# Patient Record
Sex: Female | Born: 1960 | Race: Black or African American | Hispanic: No | Marital: Married | Smoking: Former smoker
Health system: Southern US, Community
[De-identification: ages and names within clinical notes are randomized; demographics above are authoritative.]

## PROBLEM LIST (undated history)

## (undated) ENCOUNTER — Ambulatory Visit (HOSPITAL_COMMUNITY): Admission: EM | Payer: Commercial Managed Care - HMO | Source: Home / Self Care

## (undated) DIAGNOSIS — G4730 Sleep apnea, unspecified: Secondary | ICD-10-CM

## (undated) DIAGNOSIS — R609 Edema, unspecified: Secondary | ICD-10-CM

## (undated) DIAGNOSIS — M51369 Other intervertebral disc degeneration, lumbar region without mention of lumbar back pain or lower extremity pain: Secondary | ICD-10-CM

## (undated) DIAGNOSIS — K219 Gastro-esophageal reflux disease without esophagitis: Secondary | ICD-10-CM

## (undated) DIAGNOSIS — G589 Mononeuropathy, unspecified: Secondary | ICD-10-CM

## (undated) DIAGNOSIS — D219 Benign neoplasm of connective and other soft tissue, unspecified: Secondary | ICD-10-CM

## (undated) DIAGNOSIS — M1990 Unspecified osteoarthritis, unspecified site: Secondary | ICD-10-CM

## (undated) DIAGNOSIS — M21612 Bunion of left foot: Secondary | ICD-10-CM

## (undated) DIAGNOSIS — I10 Essential (primary) hypertension: Secondary | ICD-10-CM

## (undated) DIAGNOSIS — Z967 Presence of other bone and tendon implants: Secondary | ICD-10-CM

## (undated) DIAGNOSIS — Z903 Acquired absence of stomach [part of]: Secondary | ICD-10-CM

## (undated) DIAGNOSIS — I82409 Acute embolism and thrombosis of unspecified deep veins of unspecified lower extremity: Secondary | ICD-10-CM

## (undated) DIAGNOSIS — Z9889 Other specified postprocedural states: Secondary | ICD-10-CM

## (undated) DIAGNOSIS — N281 Cyst of kidney, acquired: Secondary | ICD-10-CM

## (undated) DIAGNOSIS — Z96652 Presence of left artificial knee joint: Secondary | ICD-10-CM

## (undated) DIAGNOSIS — R42 Dizziness and giddiness: Secondary | ICD-10-CM

## (undated) DIAGNOSIS — M722 Plantar fascial fibromatosis: Secondary | ICD-10-CM

## (undated) DIAGNOSIS — K6289 Other specified diseases of anus and rectum: Secondary | ICD-10-CM

## (undated) DIAGNOSIS — M1712 Unilateral primary osteoarthritis, left knee: Secondary | ICD-10-CM

## (undated) DIAGNOSIS — M5136 Other intervertebral disc degeneration, lumbar region: Secondary | ICD-10-CM

## (undated) HISTORY — DX: Presence of left artificial knee joint: Z96.652

## (undated) HISTORY — DX: Essential (primary) hypertension: I10

## (undated) HISTORY — DX: Other intervertebral disc degeneration, lumbar region: M51.36

## (undated) HISTORY — DX: Unspecified osteoarthritis, unspecified site: M19.90

## (undated) HISTORY — PX: REVISION TOTAL KNEE ARTHROPLASTY: SUR1280

## (undated) HISTORY — DX: Acquired absence of stomach (part of): Z90.3

## (undated) HISTORY — DX: Plantar fascial fibromatosis: M72.2

## (undated) HISTORY — DX: Benign neoplasm of connective and other soft tissue, unspecified: D21.9

## (undated) HISTORY — PX: ORIF RADIAL FRACTURE: SHX5113

## (undated) HISTORY — DX: Sleep apnea, unspecified: G47.30

## (undated) HISTORY — DX: Acute embolism and thrombosis of unspecified deep veins of unspecified lower extremity: I82.409

## (undated) HISTORY — DX: Other intervertebral disc degeneration, lumbar region without mention of lumbar back pain or lower extremity pain: M51.369

## (undated) HISTORY — DX: Other specified postprocedural states: Z98.890

## (undated) HISTORY — DX: Mononeuropathy, unspecified: G58.9

## (undated) HISTORY — DX: Gastro-esophageal reflux disease without esophagitis: K21.9

## (undated) HISTORY — DX: Edema, unspecified: R60.9

## (undated) HISTORY — DX: Cyst of kidney, acquired: N28.1

## (undated) HISTORY — DX: Unilateral primary osteoarthritis, left knee: M17.12

## (undated) HISTORY — DX: Dizziness and giddiness: R42

## (undated) HISTORY — DX: Presence of other bone and tendon implants: Z96.7

## (undated) HISTORY — DX: Other specified diseases of anus and rectum: K62.89

## (undated) HISTORY — DX: Bunion of left foot: M21.612

## (undated) HISTORY — PX: TOTAL KNEE ARTHROPLASTY: SHX125

## (undated) HISTORY — PX: ABDOMINAL HYSTERECTOMY: SHX81

## (undated) HISTORY — PX: BREAST SURGERY: SHX581

## (undated) HISTORY — DX: Morbid (severe) obesity due to excess calories: E66.01

---

## 1999-07-22 ENCOUNTER — Encounter (INDEPENDENT_AMBULATORY_CARE_PROVIDER_SITE_OTHER): Payer: Self-pay

## 1999-07-22 ENCOUNTER — Other Ambulatory Visit: Admission: RE | Admit: 1999-07-22 | Discharge: 1999-07-22 | Payer: Self-pay | Admitting: Obstetrics

## 2000-11-28 ENCOUNTER — Other Ambulatory Visit: Admission: RE | Admit: 2000-11-28 | Discharge: 2000-11-28 | Payer: Self-pay | Admitting: Obstetrics

## 2000-12-13 ENCOUNTER — Ambulatory Visit (HOSPITAL_COMMUNITY): Admission: RE | Admit: 2000-12-13 | Discharge: 2000-12-13 | Payer: Self-pay | Admitting: Obstetrics

## 2000-12-13 ENCOUNTER — Encounter: Payer: Self-pay | Admitting: Obstetrics

## 2001-04-15 ENCOUNTER — Ambulatory Visit (HOSPITAL_COMMUNITY): Admission: RE | Admit: 2001-04-15 | Discharge: 2001-04-15 | Payer: Self-pay | Admitting: Obstetrics

## 2001-04-15 ENCOUNTER — Encounter: Payer: Self-pay | Admitting: Obstetrics

## 2002-01-27 ENCOUNTER — Emergency Department (HOSPITAL_COMMUNITY): Admission: EM | Admit: 2002-01-27 | Discharge: 2002-01-27 | Payer: Self-pay | Admitting: Emergency Medicine

## 2003-05-14 ENCOUNTER — Encounter: Admission: RE | Admit: 2003-05-14 | Discharge: 2003-05-14 | Payer: Self-pay | Admitting: Internal Medicine

## 2003-06-01 ENCOUNTER — Ambulatory Visit (HOSPITAL_COMMUNITY): Admission: RE | Admit: 2003-06-01 | Discharge: 2003-06-01 | Payer: Self-pay | Admitting: Obstetrics

## 2003-07-04 DIAGNOSIS — Z90710 Acquired absence of both cervix and uterus: Secondary | ICD-10-CM

## 2003-07-04 HISTORY — DX: Acquired absence of both cervix and uterus: Z90.710

## 2003-09-10 ENCOUNTER — Encounter: Admission: RE | Admit: 2003-09-10 | Discharge: 2003-09-10 | Payer: Self-pay | Admitting: Cardiology

## 2003-09-23 ENCOUNTER — Encounter (INDEPENDENT_AMBULATORY_CARE_PROVIDER_SITE_OTHER): Payer: Self-pay | Admitting: Specialist

## 2003-09-23 ENCOUNTER — Inpatient Hospital Stay (HOSPITAL_COMMUNITY): Admission: RE | Admit: 2003-09-23 | Discharge: 2003-09-26 | Payer: Self-pay | Admitting: Obstetrics

## 2003-10-15 ENCOUNTER — Emergency Department (HOSPITAL_COMMUNITY): Admission: EM | Admit: 2003-10-15 | Discharge: 2003-10-15 | Payer: Self-pay | Admitting: Emergency Medicine

## 2005-04-07 ENCOUNTER — Other Ambulatory Visit: Admission: RE | Admit: 2005-04-07 | Discharge: 2005-04-07 | Payer: Self-pay | Admitting: Family Medicine

## 2005-04-07 ENCOUNTER — Encounter: Admission: RE | Admit: 2005-04-07 | Discharge: 2005-04-07 | Payer: Self-pay | Admitting: Family Medicine

## 2006-04-17 ENCOUNTER — Encounter: Admission: RE | Admit: 2006-04-17 | Discharge: 2006-04-17 | Payer: Self-pay | Admitting: Obstetrics and Gynecology

## 2007-04-22 ENCOUNTER — Encounter: Admission: RE | Admit: 2007-04-22 | Discharge: 2007-04-22 | Payer: Self-pay | Admitting: Family Medicine

## 2008-01-07 ENCOUNTER — Inpatient Hospital Stay (HOSPITAL_COMMUNITY): Admission: RE | Admit: 2008-01-07 | Discharge: 2008-01-11 | Payer: Self-pay | Admitting: Orthopedic Surgery

## 2008-01-10 ENCOUNTER — Encounter (INDEPENDENT_AMBULATORY_CARE_PROVIDER_SITE_OTHER): Payer: Self-pay | Admitting: Orthopedic Surgery

## 2008-01-10 ENCOUNTER — Ambulatory Visit: Payer: Self-pay | Admitting: Surgery

## 2008-03-03 ENCOUNTER — Ambulatory Visit: Admission: RE | Admit: 2008-03-03 | Discharge: 2008-03-03 | Payer: Self-pay | Admitting: Orthopedic Surgery

## 2008-03-03 ENCOUNTER — Ambulatory Visit: Payer: Self-pay | Admitting: Surgery

## 2008-03-03 ENCOUNTER — Encounter (INDEPENDENT_AMBULATORY_CARE_PROVIDER_SITE_OTHER): Payer: Self-pay | Admitting: Orthopedic Surgery

## 2008-04-29 ENCOUNTER — Encounter: Admission: RE | Admit: 2008-04-29 | Discharge: 2008-04-29 | Payer: Self-pay | Admitting: Family Medicine

## 2008-05-26 ENCOUNTER — Encounter: Admission: RE | Admit: 2008-05-26 | Discharge: 2008-05-26 | Payer: Self-pay | Admitting: Family Medicine

## 2008-08-20 ENCOUNTER — Other Ambulatory Visit: Admission: RE | Admit: 2008-08-20 | Discharge: 2008-08-20 | Payer: Self-pay | Admitting: Family Medicine

## 2008-12-31 ENCOUNTER — Emergency Department (HOSPITAL_COMMUNITY): Admission: EM | Admit: 2008-12-31 | Discharge: 2008-12-31 | Payer: Self-pay | Admitting: Emergency Medicine

## 2009-04-23 ENCOUNTER — Ambulatory Visit (HOSPITAL_COMMUNITY): Admission: RE | Admit: 2009-04-23 | Discharge: 2009-04-23 | Payer: Self-pay | Admitting: Orthopedic Surgery

## 2009-04-30 ENCOUNTER — Encounter: Admission: RE | Admit: 2009-04-30 | Discharge: 2009-04-30 | Payer: Self-pay | Admitting: Family Medicine

## 2010-05-02 ENCOUNTER — Encounter: Admission: RE | Admit: 2010-05-02 | Discharge: 2010-05-02 | Payer: Self-pay | Admitting: Family Medicine

## 2010-07-24 ENCOUNTER — Encounter: Payer: Self-pay | Admitting: Family Medicine

## 2010-08-23 ENCOUNTER — Ambulatory Visit (INDEPENDENT_AMBULATORY_CARE_PROVIDER_SITE_OTHER): Payer: BC Managed Care – PPO

## 2010-08-23 ENCOUNTER — Inpatient Hospital Stay (INDEPENDENT_AMBULATORY_CARE_PROVIDER_SITE_OTHER)
Admission: RE | Admit: 2010-08-23 | Discharge: 2010-08-23 | Disposition: A | Discharge status: Home / Self Care | Payer: BC Managed Care – PPO | Source: Ambulatory Visit | Attending: Emergency Medicine | Admitting: Emergency Medicine

## 2010-08-23 DIAGNOSIS — M1990 Unspecified osteoarthritis, unspecified site: Secondary | ICD-10-CM

## 2010-08-23 DIAGNOSIS — M25469 Effusion, unspecified knee: Secondary | ICD-10-CM

## 2010-09-19 ENCOUNTER — Other Ambulatory Visit (HOSPITAL_COMMUNITY): Payer: Self-pay | Admitting: Orthopedic Surgery

## 2010-09-19 DIAGNOSIS — M25562 Pain in left knee: Secondary | ICD-10-CM

## 2010-09-26 ENCOUNTER — Encounter (HOSPITAL_COMMUNITY)
Admission: RE | Admit: 2010-09-26 | Discharge: 2010-09-26 | Disposition: A | Discharge status: Home / Self Care | Payer: BC Managed Care – PPO | Source: Ambulatory Visit | Attending: Orthopedic Surgery | Admitting: Orthopedic Surgery

## 2010-09-26 ENCOUNTER — Ambulatory Visit (HOSPITAL_COMMUNITY)
Admission: RE | Admit: 2010-09-26 | Discharge: 2010-09-26 | Disposition: A | Discharge status: Home / Self Care | Payer: BC Managed Care – PPO | Source: Ambulatory Visit | Attending: Orthopedic Surgery | Admitting: Orthopedic Surgery

## 2010-09-26 DIAGNOSIS — M25569 Pain in unspecified knee: Secondary | ICD-10-CM | POA: Insufficient documentation

## 2010-09-26 DIAGNOSIS — M25562 Pain in left knee: Secondary | ICD-10-CM

## 2010-09-26 DIAGNOSIS — I8390 Asymptomatic varicose veins of unspecified lower extremity: Secondary | ICD-10-CM | POA: Insufficient documentation

## 2010-09-26 DIAGNOSIS — Z96659 Presence of unspecified artificial knee joint: Secondary | ICD-10-CM | POA: Insufficient documentation

## 2010-09-26 MED ORDER — TECHNETIUM TC 99M MEDRONATE IV KIT
25.0000 | PACK | Freq: Once | INTRAVENOUS | Status: AC | PRN
  Administered 2010-09-26: 23.6 via INTRAVENOUS

## 2010-10-09 LAB — CBC
HCT: 36.6 % (ref 36.0–46.0)
Hemoglobin: 12.4 g/dL (ref 12.0–15.0)
MCHC: 34 g/dL (ref 30.0–36.0)
MCV: 86.4 fL (ref 78.0–100.0)
Platelets: 260 10*3/uL (ref 150–400)
RBC: 4.24 MIL/uL (ref 3.87–5.11)
RDW: 14.5 % (ref 11.5–15.5)
WBC: 5.9 10*3/uL (ref 4.0–10.5)

## 2010-10-09 LAB — BASIC METABOLIC PANEL
BUN: 4 mg/dL — ABNORMAL LOW (ref 6–23)
CO2: 30 mEq/L (ref 19–32)
Calcium: 9.3 mg/dL (ref 8.4–10.5)
Chloride: 106 mEq/L (ref 96–112)
Creatinine, Ser: 0.68 mg/dL (ref 0.4–1.2)
GFR calc Af Amer: >60 mL/min (ref >60)
GFR calc non Af Amer: >60 mL/min (ref >60)
Glucose, Bld: 105 mg/dL — ABNORMAL HIGH (ref 70–99)
Potassium: 4 mEq/L (ref 3.5–5.1)
Sodium: 141 mEq/L (ref 135–145)

## 2010-10-09 LAB — DIFFERENTIAL
Basophils Absolute: 0 10*3/uL (ref 0.0–0.1)
Basophils Relative: 0 % (ref 0–1)
Eosinophils Absolute: 0 10*3/uL (ref 0.0–0.7)
Eosinophils Relative: 1 % (ref 0–5)
Lymphocytes Relative: 18 % (ref 12–46)
Lymphs Abs: 1.1 10*3/uL (ref 0.7–4.0)
Monocytes Absolute: 0.5 10*3/uL (ref 0.1–1.0)
Monocytes Relative: 9 % (ref 3–12)
Neutro Abs: 4.3 10*3/uL (ref 1.7–7.7)
Neutrophils Relative %: 73 % (ref 43–77)

## 2010-11-07 ENCOUNTER — Other Ambulatory Visit: Payer: Self-pay | Admitting: Orthopedic Surgery

## 2010-11-07 ENCOUNTER — Other Ambulatory Visit (HOSPITAL_COMMUNITY): Payer: Self-pay | Admitting: Orthopedic Surgery

## 2010-11-07 ENCOUNTER — Encounter (HOSPITAL_COMMUNITY): Payer: BC Managed Care – PPO

## 2010-11-07 ENCOUNTER — Ambulatory Visit (HOSPITAL_COMMUNITY)
Admission: RE | Admit: 2010-11-07 | Discharge: 2010-11-07 | Disposition: A | Discharge status: Home / Self Care | Payer: BC Managed Care – PPO | Source: Ambulatory Visit | Attending: Orthopedic Surgery | Admitting: Orthopedic Surgery

## 2010-11-07 DIAGNOSIS — X58XXXA Exposure to other specified factors, initial encounter: Secondary | ICD-10-CM | POA: Insufficient documentation

## 2010-11-07 DIAGNOSIS — Z01812 Encounter for preprocedural laboratory examination: Secondary | ICD-10-CM | POA: Insufficient documentation

## 2010-11-07 DIAGNOSIS — T84029A Dislocation of unspecified internal joint prosthesis, initial encounter: Secondary | ICD-10-CM | POA: Insufficient documentation

## 2010-11-07 DIAGNOSIS — Z01818 Encounter for other preprocedural examination: Secondary | ICD-10-CM | POA: Insufficient documentation

## 2010-11-07 DIAGNOSIS — Z79899 Other long term (current) drug therapy: Secondary | ICD-10-CM | POA: Insufficient documentation

## 2010-11-07 DIAGNOSIS — I10 Essential (primary) hypertension: Secondary | ICD-10-CM

## 2010-11-07 DIAGNOSIS — Z86718 Personal history of other venous thrombosis and embolism: Secondary | ICD-10-CM | POA: Insufficient documentation

## 2010-11-07 DIAGNOSIS — Z90710 Acquired absence of both cervix and uterus: Secondary | ICD-10-CM | POA: Insufficient documentation

## 2010-11-07 DIAGNOSIS — Z96659 Presence of unspecified artificial knee joint: Secondary | ICD-10-CM | POA: Insufficient documentation

## 2010-11-07 LAB — BASIC METABOLIC PANEL
CO2: 27 mEq/L (ref 19–32)
Calcium: 9.8 mg/dL (ref 8.4–10.5)
Chloride: 101 mEq/L (ref 96–112)
GFR calc Af Amer: >60 mL/min (ref >60)
Glucose, Bld: 99 mg/dL (ref 70–99)
Potassium: 4.2 mEq/L (ref 3.5–5.1)
Sodium: 136 mEq/L (ref 135–145)

## 2010-11-07 LAB — DIFFERENTIAL
Basophils Absolute: 0 10*3/uL (ref 0.0–0.1)
Basophils Relative: 0 % (ref 0–1)
Eosinophils Absolute: 0.1 10*3/uL (ref 0.0–0.7)
Lymphs Abs: 2.2 10*3/uL (ref 0.7–4.0)
Monocytes Relative: 8 % (ref 3–12)
Neutro Abs: 4.2 10*3/uL (ref 1.7–7.7)
Neutrophils Relative %: 59 % (ref 43–77)

## 2010-11-07 LAB — PROTIME-INR
INR: 0.97 (ref 0.00–1.49)
Prothrombin Time: 13.1 seconds (ref 11.6–15.2)

## 2010-11-07 LAB — CBC
HCT: 39.6 % (ref 36.0–46.0)
Hemoglobin: 12.8 g/dL (ref 12.0–15.0)
MCH: 27.1 pg (ref 26.0–34.0)
MCHC: 32.3 g/dL (ref 30.0–36.0)
Platelets: 323 10*3/uL (ref 150–400)
RBC: 4.73 MIL/uL (ref 3.87–5.11)
RDW: 13.7 % (ref 11.5–15.5)
WBC: 7.2 10*3/uL (ref 4.0–10.5)

## 2010-11-07 LAB — URINALYSIS, ROUTINE W REFLEX MICROSCOPIC
Glucose, UA: NEGATIVE mg/dL
Hgb urine dipstick: NEGATIVE
Ketones, ur: NEGATIVE mg/dL
Nitrite: NEGATIVE
Protein, ur: NEGATIVE mg/dL
Specific Gravity, Urine: 1.02 (ref 1.005–1.030)
Urobilinogen, UA: 0.2 mg/dL (ref 0.0–1.0)
pH: 7.5 (ref 5.0–8.0)

## 2010-11-07 LAB — SURGICAL PCR SCREEN
MRSA, PCR: NEGATIVE
Staphylococcus aureus: NEGATIVE

## 2010-11-07 LAB — APTT: aPTT: 28 seconds (ref 24–37)

## 2010-11-14 DIAGNOSIS — R0602 Shortness of breath: Secondary | ICD-10-CM | POA: Diagnosis not present

## 2010-11-14 DIAGNOSIS — T84029A Dislocation of unspecified internal joint prosthesis, initial encounter: Principal | ICD-10-CM | POA: Diagnosis present

## 2010-11-14 DIAGNOSIS — Z96659 Presence of unspecified artificial knee joint: Secondary | ICD-10-CM

## 2010-11-14 DIAGNOSIS — I10 Essential (primary) hypertension: Secondary | ICD-10-CM | POA: Diagnosis present

## 2010-11-14 DIAGNOSIS — G4733 Obstructive sleep apnea (adult) (pediatric): Secondary | ICD-10-CM | POA: Diagnosis present

## 2010-11-14 DIAGNOSIS — M1710 Unilateral primary osteoarthritis, unspecified knee: Secondary | ICD-10-CM | POA: Diagnosis present

## 2010-11-14 DIAGNOSIS — X58XXXA Exposure to other specified factors, initial encounter: Secondary | ICD-10-CM | POA: Diagnosis present

## 2010-11-14 DIAGNOSIS — R079 Chest pain, unspecified: Secondary | ICD-10-CM | POA: Diagnosis not present

## 2010-11-14 DIAGNOSIS — Z01812 Encounter for preprocedural laboratory examination: Secondary | ICD-10-CM

## 2010-11-14 LAB — TYPE AND SCREEN: ABO/RH(D): AB POS

## 2010-11-14 NOTE — Op Note (Signed)
NAMEAKEILA, [name]              ACCOUNT NO.:  [id number]  MEDICAL RECORD NO.:  [id number]           PATIENT TYPE:  I  LOCATION:  0008                         FACILITY:  [facility]  PHYSICIAN:  Madlyn Frankel. Charlann Boxer, M.D.  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  FINDINGS:  The patient had no clinical concerns for infection in the preop state, nor perioperatively with normal clear synovial fluid.  PROCEDURE:  Revision left total knee replacement with polyethylene exchange.  The patient had a previously placed standard plus LCS femoral component that we matched with the new poly to increase the size 10 mm to 17.5 mm poly.  SURGEON:  Madlyn Frankel. Charlann Boxer, M.D.  ASSISTANT:  Jaquelyn Bitter. Chabon, P.A.  ANESTHESIA:  Preoperative regional femoral block plus general.  SPECIMENS:  None.  COMPLICATIONS:  None.  DRAINS:  One Hemovac.  TOURNIQUET TIME:  30 minutes at 250 mmHg.  INDICATIONS FOR PROCEDURE:  [name] is a 49 year old female with history of index left total knee replacement performed in 2009.  She presented to the office for second opinion evaluation due to recurring swelling and pain.  Diagnostic evaluation and clinical exam revealed concerns of instability from hyperextension through flexion.  Bone scan was relatively normal other than some increased uptake in the tip of the femoral tibial stem but this did not correlate with her symptoms.  After reviewing these findings and ruling out infection as source for her pain, we discussed revision surgery in setting of polyethylene exchange.  She appeared to have global instability with hyperextension as well as flexion instability.  For this reason, I  felt that we could perform this revision by way of polyethylene exchange as oppose to femoral revision.  After reviewing with her surgical options, she wished to proceed at this time with revision surgery.  Consent was obtained for benefit of pain relief including after reviewing risks of infection, DVT, and necessity for further revision surgery.  PROCEDURE IN DETAIL:  The patient was brought to the operative room theater.  Once adequate anesthesia, preoperative antibiotics had been administered, she was positioned supine with left thigh tourniquet placed.  I had a chance to re-examine the knee at this point under anesthesia confirming the clinical diagnosis made through the office.  The left lower extremity was prepped and draped in sterile fashion with the left foot placed in DeMayo leg holder.  The time-out was performed identifying the patient, planned procedure and the extremity.  The left lower extremity was then exsanguinated and tourniquet elevated to 250 mmHg.  The patient's old incision was utilized including its entire extent which I excised in an effort to revise the scar.  Sharp dissection was carried down to the extensor mechanism with soft tissue planes created.  The median arthrotomy was made including encountering normal synovial fluid, probably about 30 cc worth suctioned out.  This was clear synovial  fluid.  At this point, the first portion of this case was dedicated to a complete synovectomy in the medial and lateral aspects of the knees, peripatellar region, suprapatellar pouch and laterally. Following this complete synovectomy, the old size 10 mm polyethylene insert was removed.  A trial was carried out with 17.5 insert to make certain I was going to be happy with this and I felt the knee at this point blocked at full extension, no hyperextension, and the knee ligaments felt much more stable and balanced through flexion with normal tracking patella.  Given  this trial, this 17.5 insert to match the standard plus femoral component was chosen.  The knee was irrigated with normal saline solution, pulse lavage, and the final insert placed.  The final insert was then placed into the knee.  The knee was reirrigated.  The tourniquet was let down after 30 minutes.  The medium Hemovac drain was placed deep.  The extensor mechanism was then reapproximated using #1 Vicryl with the knee in flexion.  The remainder of the wound was closed with 2-0 Vicryl and a running 4-0 Monocryl.  The knee was cleaned, dried and dressed sterilely using Dermabond and Aquacel dressing, drain site dressed separately.  She was then extubated and brought to recovery room in stable condition, tolerated the procedure well.     Madlyn Frankel Charlann Boxer, M.D.     MDO/MEDQ  D:  11/14/2010  T:  11/14/2010  Job:  [id number]  Electronically Signed by Durene Romans M.D. on 11/14/2010 03:45:01 PM

## 2010-11-14 NOTE — H&P (Signed)
[name], [name]              ACCOUNT NO.:  [id number]  MEDICAL RECORD NO.:  [id number]          PATIENT TYPE:  LOCATION:                                 FACILITY:  PHYSICIAN:  Madlyn Frankel. Charlann Boxer, M.D.  DATE OF BIRTH:  [birth year]  DATE OF ADMISSION: DATE OF DISCHARGE:                             HISTORY & PHYSICAL   DATE OF SURGERY:  Nov 14, 2010.  ADMISSION DIAGNOSIS:  Instability, left knee.  HISTORY OF PRESENT ILLNESS:  This is a 50 year old lady with a history of total knee arthroplasty with instability who now is scheduled for poly exchange of her left total knee arthroplasty.  The surgery risk, benefits, and aftercare were discussed with the patient.  Questions invited and answered.  She understands there is a small chance that this may require a total knee revision once we were in there, and she would like to go ahead and get this fixed while there but hopefully this will be a straight poly exchange.  The surgery, risks, benefits, and aftercare were discussed with the patient.  Questions invited and answered.  Note that she has had a history of DVT and is not a candidate for tranexamic  acid and will not receive that in preop, also we will put her on  Xarelto for 10 days postoperatively and then proceed with 4 weeks of aspirin thereafter.  PAST MEDICAL HISTORY:  Drug allergy to OXYCODONE with nausea and vomiting.  CURRENT MEDICATIONS:  Advil.  MEDICAL ILLNESSES:  History of DVT.  PAST SURGERIES:  Hysterectomy and left total knee arthroplasty in 2009.  FAMILY HISTORY:  Positive for ovarian cancer.  SOCIAL HISTORY:  The patient is married.  She does not smoke and drinks rarely, and she will be going home after surgery.  REVIEW OF SYSTEMS:  CENTRAL NERVOUS SYSTEM:  Negative for headache, blurred vision, or dizziness.  PULMONARY:  Negative for shortness breath, PND or orthopnea.  CARDIOVASCULAR:  Negative for chest pain or palpitation.  GI:  Negative for ulcers,  hepatitis.  GU:  Negative for urinary tract difficulty.  MUSCULOSKELETAL:  Positive as in HPI plus arthritis in the right knee.  PHYSICAL EXAMINATION:  VITAL SIGNS:  BP 150/98, respirations 18, pulse 70 and regular. GENERAL APPEARANCE:  This is a well developed, well nourished lady in no acute distress. HEENT:  Head normocephalic.  Nose patent.  Ears patent.  Pupils equal, round and reactive to light.  Throat without injection. NECK:  Supple without adenopathy.  Carotids 2+ without bruit. CHEST:  Clear to auscultation.  No rales, rhonchi.  Respirations 18. HEART:  Regular rate and rhythm at 78 beats per minute without murmur. ABDOMEN:  Soft and normoactive bowel sounds.  No masses or organomegaly. NEUROLOGIC:  The patient is alert and oriented to time, place, and person.  Cranial nerves II through XII grossly intact. EXTREMITIES:  Osteoarthritis of the right knee with decreased range of motion and pain, left knee shows status post total knee arthroplasty with medial lateral instability.  Neurovascular status intact.  IMPRESSION:  Left knee instability status post total knee arthroplasty.  PLAN:  Left knee poly revision.  Note  that the patient does have sleep apnea and has not received her CPAP yet she will get it before surgery. She is advised to bring her mask with her to the hospital to use postoperatively.     Jaquelyn Bitter. Chabon, P.A.   ______________________________ Madlyn Frankel Charlann Boxer, M.D.    SJC/MEDQ  D:  11/02/2010  T:  11/03/2010  Job:  [id number]  Electronically Signed by Jodene Nam P.A. on 11/14/2010 11:53:55 AM Electronically Signed by Durene Romans M.D. on 11/14/2010 03:44:56 PM

## 2010-11-15 LAB — CBC
HCT: 33.8 % — ABNORMAL LOW (ref 36.0–46.0)
MCH: 26.9 pg (ref 26.0–34.0)
MCV: 82.6 fL (ref 78.0–100.0)
RBC: 4.09 MIL/uL (ref 3.87–5.11)
WBC: 7.6 10*3/uL (ref 4.0–10.5)

## 2010-11-15 LAB — BASIC METABOLIC PANEL
BUN: 8 mg/dL (ref 6–23)
CO2: 27 mEq/L (ref 19–32)
Chloride: 101 mEq/L (ref 96–112)
Creatinine, Ser: 0.51 mg/dL (ref 0.4–1.2)
Glucose, Bld: 125 mg/dL — ABNORMAL HIGH (ref 70–99)
Potassium: 4.2 mEq/L (ref 3.5–5.1)

## 2010-11-15 NOTE — Op Note (Signed)
NAMEJENIAH, KISHI              ACCOUNT NO.:  [id number]   MEDICAL RECORD NO.:  [id number]          PATIENT TYPE:  INP   LOCATION:  0006                         [facility]ACILITY[facility]   PHYSICIAN:  Deidre Ala, M.D.    DATE OF BIRTH:  [birth year]   DATE OF PROCEDURE:  01/07/2008  DATE OF DISCHARGE:                               OPERATIVE REPORT   PREOPERATIVE DIAGNOSIS:  End-stage degenerative joint disease left knee.   POSTOPERATIVE DIAGNOSIS:  End-stage degenerative joint disease left  knee.   PROCEDURE:  Left total knee arthroplasty using cemented DePuy  components, LCS type, with rotating platform, and MBT revision stem.   SURGEON:  1. Charlesetta Shanks, M.D.   ASSISTANT:  Phineas Semen, P.A.-C.   ANESTHESIA:  General with femoral nerve block, with LMA.   CULTURES:  None.   DRAINS:  Two medium Hemovacs to self suction.   ESTIMATED BLOOD LOSS:  100 mL.   REPLACEMENT:  Without.   TOURNIQUET TIME:  1 hour 8 minutes.   PATHOLOGIC FINDINGS AND HISTORY:  Ms. Brass is a rather large woman  who has had chronic knee pain.  Had a full workup with MRI and was near  bone on bone, and elected not to proceed with arthroscopy because of  symptom complex characteristics, and cortisone shots which had not  helped.  She therefore elected to proceed with operative intervention  with total knee.  She had evidence of medial joint line narrowing.  At  surgery, she had severe erosion with excoriation above the medial  femoral condyle, medial tibial plateau, and posterior patella.  We  fitted her with a standard plus left femur, #4 revision cemented tray,  with an MBT type, with a 75 x 14 mm flute.  We used an oval dome 3-peg  all poly patella, and a size 10 standard plus insert.  We had full  extension equivalent to her preoperative, which was slight hyper, good  ligamentous stability, with flexion to about 105 degrees, with good  patellar tracking.   PROCEDURE:  With adequate anesthesia  obtained, using endotracheal  technique, after femoral nerve block the patient was placed in the  supine position.  The left lower extremity was prepped from the toes to  the tourniquet in standard fashion.  After standard prepping and  draping, Esmarch examination was used.  The tourniquet was let up to 400  mmHg.  Median parapatellar skin incision was followed by a median  parapatellar retinacular incision.  The incision was deepened sharply  with a knife.  Good hemostasis was obtained using a Arts administrator.  The dissection was carried through the retinaculum.  The patella was everted.  Fat pad excised, as well as both menisci and  the cruciates.  We then exposed the anterior spine and made an anterior  spine and posterior tibial spines, and cut them flat with a saw.  Intramedullary drill was used with slight varus angle to effect leg  valgus, and reaming was carried up to a 15.  The 15 was left in, and we  placed the tibial cutting jig in place  and made a very conservative cut  just under the medial joint line with a 6 mm stripe.  The cut was made  and freshened.  We then sized the femur to a standard plus, placed the  intramedullary guide, set it with a C-clamp, pinned it, and then moved  it up 2.5 mm, and fit the C clamp appropriately with a little bit of  looseness with the 10.  I then made the anterior and posterior cut  without notching, and fit the 10 mm lollipop in flexion.  We did do  somewhat of a release on the medial side to balance it.  I then placed a  4-degree valgus distal femoral cutting jig in place and made that cut,  and the first setting fit the 10 in full extension.  We then placed the  finishing guide. made the anterior and posterior notch cuts with the  chamfer cuts.  I then exposed the proximal tibia and sized it to a 4,  made the central peg hole in the distal drill, and then implanted the  trial 75 x 14, #4, and set it with a keel punch.  The 10 mm  rotating  platform trial was put in place, as well as the femoral component, and  the knee was articulated through a range of motion.  We then the  calipered the patella to a 25-26, cut it down to a 16, and placed a 38  mm patella, replacing 9.  We placed a 3-hole drill guide, made those  holes cuts, and the patella set down nicely and articulated well through  a range of motion.  We then removed all trial components and jet lavaged  the knee while component sizing was checked as they came on the field.  We then assembled the stem on the tibia.  We then mixed the cement in  the cement gun.  We then exposed the proximal tibia, cemented on the  tibial component, and impacted it and removed excess cement.  We then  put in a rotating platform.  We then cemented on the femoral component,  impacted it, removed excess cement.  We then held the knee in full  extension, impacted it, and removed excess cement.  We then held the  knee in extension while the cement cured.  We cemented on the patella  component, impacted it, removed excess cement, and held it with a clamp  until the cement had cured.  We used tobramycin 2 batches in 2 batches  of cement.  The cement had cured and additional jet lavage carried out.  The tourniquet was let down, and bleeding points were cauterized.  FloSeal was injected in the wound.  Hemovac drains were placed in the  medial lateral gutter and brought out through the superolateral portal.  We then close the knee with #1 Vicryl figure-of-eight interrupted on the  retinaculum, with a running-locking oversew of #1 PDS.  We then used 0  Vicryl subcu interrupted buried stitches, and then a 0-0 and 2-0 quill  suture in the subcutaneum.  The skin was closed with staples.  Hemovac  was hooked up to self-suction.  A bulky sterile compressive dressing was  applied, and the patient, having tolerated the procedure well, was  awakened and taken to recovery the room in satisfactory  condition, to be  admitted for routine postoperative care, CPM, and analgesia.           ______________________________  V. Charlesetta Shanks, M.D.  VEP/MEDQ  D:  01/07/2008  T:  01/07/2008  Job:  [id number]   cc:   Deatra James, M.D.  Fax: [phone]

## 2010-11-15 NOTE — Discharge Summary (Signed)
[name], [name]              ACCOUNT NO.:  [id number]   MEDICAL RECORD NO.:  [id number]          PATIENT TYPE:  INP   LOCATION:  1607                         FACILITY:  [facility]   PHYSICIAN:  Deidre Ala, M.D.    DATE OF BIRTH:  1960-10-22   DATE OF ADMISSION:  01/07/2008  DATE OF DISCHARGE:  01/11/2008                               DISCHARGE SUMMARY   FINAL DIAGNOSES:  1. Degenerative joint disease, left knee.  2. Postoperative blood loss anemia.   PROCEDURE:  On January 07, 2008, left knee total arthroplasty.   SURGEON:  1. Charlesetta Shanks, M.D.   HISTORY:  This is a 50 year old [place] American female who is followed  by Dr. Renae Fickle.  She has been having left knee problems.  She failed  medical and conservative management.  She is now ready for total knee  arthroplasty.  She is subsequently scheduled.   HOSPITAL COURSE:  The patient admitted to [facility] on January 07, 2008.  At that time, she underwent a left total knee arthroplasty.  The patient tolerated the procedure well.  No intraoperative  complication.  Postoperatively, the patient had postop blood loss anemia  which remains stable.  She did not require any transfusions.  She worked  with physical therapy, was up and walking on the first postoperative  day.  She continued to progress in a satisfactory manner.  She did have  some calf pain on the third postoperative day.  Dopplers were done which  were negative for DVT.  Subsequently on the fourth postoperative day  which was January 11, 2008, she was discharged home in satisfactory and  stable condition.   DISCHARGE MEDICATIONS:  1. Lovenox 30 mg 1 subcu. q.12 h.  She will continue this for the next      10 days.  She was on this in the hospital.  2. She will be given Percocet 5/325 1-2 p.o. q.4-6 h. for pain, 50 of      these, no refills.  3. She will be given Skelaxin 800 mg 1 p.o. q.i.d. p.r.n., 50 of      these, no refills.   Overall, she has been well in  the hospital.  She was subsequently  discharged.   FOLLOW UP:  She will follow up Dr. Renae Fickle in 10 days after discharge for a  recheck.      Phineas Semen, P.A.    ______________________________  Seth Bake. Charlesetta Shanks, M.D.   CL/MEDQ  D:  01/10/2008  T:  01/10/2008  Job:  [id number]

## 2010-11-16 ENCOUNTER — Inpatient Hospital Stay (HOSPITAL_COMMUNITY): Payer: BC Managed Care – PPO

## 2010-11-16 LAB — BASIC METABOLIC PANEL
BUN: 7 mg/dL (ref 6–23)
Calcium: 8.2 mg/dL — ABNORMAL LOW (ref 8.4–10.5)
Creatinine, Ser: 0.56 mg/dL (ref 0.4–1.2)
GFR calc non Af Amer: >60 mL/min (ref >60)
Glucose, Bld: 103 mg/dL — ABNORMAL HIGH (ref 70–99)
Potassium: 3.8 mEq/L (ref 3.5–5.1)

## 2010-11-16 LAB — CBC
HCT: 33.3 % — ABNORMAL LOW (ref 36.0–46.0)
MCH: 27 pg (ref 26.0–34.0)
MCHC: 32.7 g/dL (ref 30.0–36.0)
MCV: 82.6 fL (ref 78.0–100.0)
RDW: 13.4 % (ref 11.5–15.5)

## 2010-11-16 LAB — CARDIAC PANEL(CRET KIN+CKTOT+MB+TROPI)
CK, MB: 1.4 ng/mL (ref 0.3–4.0)
Relative Index: 1 (ref 0.0–2.5)
Relative Index: 0.9 (ref 0.0–2.5)
Total CK: 163 U/L (ref 7–177)

## 2010-11-18 NOTE — Discharge Summary (Signed)
NAME:  [name], [name]                        ACCOUNT NO.:  [id number]   MEDICAL RECORD NO.:  [id number]                   PATIENT TYPE:  INP   LOCATION:  9307                                 FACILITY:  [facility]   PHYSICIAN:  Kathreen Cosier, M.D.           DATE OF BIRTH:  1961/04/24   DATE OF ADMISSION:  09/23/2003  DATE OF DISCHARGE:                                 DISCHARGE SUMMARY   HOSPITAL COURSE:  The patient is a 50 year old female with a history of  myomas and possibly adenomyosis who was admitted for a TAH.  On admission  her hemoglobin was 8.9, white count 5.1, platelets 441.  PT/PTT normal.  Urine was negative.  Sodium was 135, potassium 4.1,  chloride 105, BUN 9,  creatinine 0.7.  The patient underwent a TAH and bilateral salpingectomy  plus a right breast biopsy by Dr. Janee Morn.  She did well postoperatively.  She had one temperature elevation to 101 on day #2 and was placed on  ampicillin 2 g IV q.6h.  She rapidly defervesced and by day #3 she was  afebrile for greater than 24 hours.  Her hemoglobin was 7.1.  She was  discharged on Tylox for pain, ferrous sulfate for her anemia.  She had an  EKG done on September 26, 2003 which was basically normal.   DISCHARGE DIAGNOSES:  1. Status post total abdominal hysterectomy and bilateral salpingectomy for     myoma uteri plus adenomyosis.                                               Kathreen Cosier, M.D.    BAM/MEDQ  D:  09/26/2003  T:  09/26/2003  Job:  [id number]

## 2010-11-18 NOTE — Op Note (Signed)
NAME:  [name], [name]                        ACCOUNT NO.:  [id number]   MEDICAL RECORD NO.:  [id number]                   PATIENT TYPE:  INP   LOCATION:  9307                                 FACILITY:  [facility]   PHYSICIAN:  Gabrielle Dare. Janee Morn, M.D.             DATE OF BIRTH:  May 25, 1961   DATE OF PROCEDURE:  09/23/2003  DATE OF DISCHARGE:                                 OPERATIVE REPORT   PREOPERATIVE DIAGNOSIS:  Right breast duct mass.   POSTOPERATIVE DIAGNOSIS:  Right breast duct mass.   PROCEDURE:  Excision of duct complex, right breast.   SURGEON:  Gabrielle Dare. Janee Morn, M.D.   ANESTHESIA:  General.   HISTORY OF PRESENT ILLNESS:  The patient is a 50 year old African American  female who was evaluated with a ductogram by Dr. Normand Sloop for clear discharge  from her right nipple.  Ductogram revealed a papillomatous appearing mass  and she presents for elective excision of this duct complex.  Of note, the  patient is undergoing a hysterectomy by Dr. Francoise Ceo and I am to  follow his procedure.   PROCEDURE IN DETAIL:  The patient remained in the operating room under  general anesthesia after Dr. Francoise Ceo completed his procedure.  The  patient's right breast and chest were prepped and draped in a sterile  fashion.  0.25% Marcaine with epinephrine was injected for a local  anesthetic.  Prior to injection of the anesthetic, the breast was palpated  and with manipulation of the breast, a small drop of clear fluid appeared  from the duct opening which was at the 6 o'clock position of the nipple.  This, again, coincides with the duct that was evaluated by the ductogram.  A  probe was inserted into this duct.  Subsequently, a circumareolar incision  was made encompassing about 40% of the areola inferomedially.  The  subcutaneous tissues were dissected down.  A wide margin of dissection was  used to excise a biopsy to include the complete duct complex and this was  guided by  palpation of the probe.  There was about an 8 mm spherical lesion  which was palpable along that duct.  A wide margin was dissected free.  Bovie was used for good hemostasis.  The duct complex was excised en toto  and sent to pathology.  The area was copiously irrigated.  Hemostasis was  obtained with Bovie cautery.  The wound was again irrigated and subsequently  closed.  First, the subcutaneous tissues were reapproximated with a series  of interrupted 3-0 Vicryl sutures.  The skin was closed with running 4-0  Monocryl subcuticular stitch.  Benzoin, Steri-Strips, and sterile dressings  were applied.  Sponge, needle, and instrument counts were correct.  The  patient tolerated the procedure well and was sent to the recovery room in  stable condition.  Gabrielle Dare Janee Morn, M.D.    BET/MEDQ  D:  09/23/2003  T:  09/24/2003  Job:  [id number]

## 2010-11-18 NOTE — Op Note (Signed)
NAME:  [name], [name]                        ACCOUNT NO.:  [id number]   MEDICAL RECORD NO.:  [id number]                   PATIENT TYPE:  INP   LOCATION:  9307                                 FACILITY:  [facility]   PHYSICIAN:  Kathreen Cosier, M.D.           DATE OF BIRTH:  March 31, 1961   DATE OF PROCEDURE:  09/23/2003  DATE OF DISCHARGE:                                 OPERATIVE REPORT   PREOPERATIVE DIAGNOSIS:  Leiomyoma uteri.   POSTOPERATIVE DIAGNOSIS:  Leiomyoma uteri, adenomyosis.   SURGEON:  Kathreen Cosier, M.D.   FIRST ASSISTANT:  Charles A. Clearance Coots, M.D.   PROCEDURE:  The patient was placed on the operating table in supine  position.  General anesthesia was administered by the anesthesiologist.  The  abdomen was prepped and draped.  The bladder was emptied with a Foley  catheter.  A transverse suprapubic incision was made through the old scar  and carried down to the rectus fascia.  The fascia was incised the length of  the incision.  The rectus muscles were retracted laterally and the  peritoneum incised longitudinally.  The uterus was enlarged to 14 weeks size  and was very boggy and she had myomas and adenomyosis.  The ovaries were  normal and she had a previous tubal ligation.  The right round ligament was  grasped with a Kelly clamp and ligated with #1 chromic.  A procedure was  done in a similar fashion on the other side.  Metzenbaum scissors were used  to dissect the bladder off the cervix and then the bladder was freed with  blunt and sharp dissection.  The right utero-ovarian ligament was grasped  and ligated with #1 chromic.  We proceeded in a similar fashion to the other  side.  The uterine vessels were skeletonized bilaterally, doubly clamped  with Heaney clamps, cut, and suture ligated with #1 Vicryl.  The procedure  was done in a similar fashion on both sides.  The cardinal and uterosacral  ligaments were grasped with a straight Kocher on the right, cut, and  suture  ligated with #1 chromic.  The procedure was done in a similar fashion on the  other side.  The specimen consisted of uterus and tubes were removed at the  cervicovaginal junction with Mayo scissors.  Modified Richardson sutures  were placed in the angles of the vagina and then the vaginal vault was run  with locking suture of #1 chromic.  The vault was left open.  Hemostasis was  satisfactory.  Blood loss 450 mL.  Lap and sponge counts were correct.  The  abdomen was closed in layers, the peritoneum with continuous suture of 0  chromic, fascia with continuous suture of 0 Dexon, and the skin was closed  with subcuticular stitch of 4-0 Monocryl.  Blood loss 450 mL.  Kathreen Cosier, M.D.    BAM/MEDQ  D:  09/23/2003  T:  09/24/2003  Job:  [id number]

## 2010-12-20 NOTE — Discharge Summary (Signed)
[name], [name]              ACCOUNT NO.:  [id number]  MEDICAL RECORD NO.:  [id number]  LOCATION:  1602                         FACILITY:  [facility]  PHYSICIAN:  Madlyn Frankel. Charlann Boxer, M.D.  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  DISCHARGE DIAGNOSES: 1. Failed left total knee replacement and knee revision surgery. 2. Chest tightness, rule out any coronary event. 3. Hypertension. 4. Reflux disease.  ADMITTING HISTORY:  [name] is a 50 year old female who had presented to our office for evaluation of the left total knee replacement was done by an outside physician done in 2009.  She presented to the office with recurrent pain and swelling of the knee.  I discussed with her the fact that she may very well have some polyethylene wear as well as some instability in the knee as a result causing her swelling.  Her preoperative workup was negative for infection.  After reviewing risks and benefits, and planned procedure, she wished at this point proceed with surgery.  Consent was obtained.  HOSPITAL COURSE:  The patient admitted for same-day surgery on Nov 14, 2010.  She underwent a revision of her left knee with polyethylene exchange.  At times, there are no signs of any infection.  After routine stay in the recovery room, she was transferred to the orthopedic ward where she remained for hospital stay.  On postop day #1, she noted to have hematocrit 33.8 and her electrolytes remained stable.  Her Hemovac drain was removed.  The Foley catheter was removed.  She was seen and evaluated by Physical Therapy.  She was placed on the Xarelto for DVT prophylaxis.  On the evening of postoperative day 1 to 2, she complained of some chest pain.  She had an EKG that was normal.  Labs were ordered and she  was noted to have no concern for myocardial infarction  and chest pain eventually resolved.  She did not require transfusion as her hematocrit on postoperative day #2 was 31.3.  Her electrolytes remained stable. Her wound remained dry.  On postoperative day #3, she was prepared for discharge.  Her pain had improved.  Her knee felt more stable to her.  Her wound was clean, dry, and intact.  After physical therapy, she was prepared for discharge to home.  DISCHARGE CONDITION:  Stable.  DISCHARGE INSTRUCTIONS:  The patient will be discharged home with home health physical therapy to work on range of motion strengthening.  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  She will return to see Dr. Durene Coleman at [facility] at [phone] in 2 weeks' time.  Any orthopedic questions can be dressed to our office and any further issues with regard to chest pains otherwise can be addressed to her primary care physician.  DISCHARGE MEDICATIONS: 1. Colace 100 mg p.o. b.i.d. as needed for constipation while on pain     medicine. 2. Aspirin 325 mg take for 30 days after she finishes Xarelto 10 mg     p.o. daily for 10 days. 3. Xarelto  10 mg p.o. daily for 10 days. 4. Iron 325 mg for 2 weeks. 5. Dilaudid 2-4 mg every 4-6 as needed for pain. 6. Robaxin 500 mg every 6 hours as needed for pain. 7. MiraLax 17 g p.o. daily as need for constipation while on pain     medicine. 8. Advil and Tylenol as needed. 9. Benicar 40 mg q.a.m. 10.Nexium 40 mg q.a.m. as needed. 11.Phentermine 37.5 mg daily.  Questions were encouraged and answered.     Madlyn Frankel Charlann Boxer, M.D.     MDO/MEDQ  D:  12/13/2010  T:  12/14/2010  Job:  [id number]  Electronically Signed by [name] M.D. on 12/20/2010 09:08:02 AM

## 2011-01-06 ENCOUNTER — Other Ambulatory Visit (HOSPITAL_COMMUNITY): Payer: BC Managed Care – PPO

## 2011-01-12 ENCOUNTER — Ambulatory Visit (HOSPITAL_COMMUNITY)
Admission: RE | Admit: 2011-01-12 | Payer: BC Managed Care – PPO | Source: Ambulatory Visit | Admitting: Orthopedic Surgery

## 2011-03-30 LAB — COMPREHENSIVE METABOLIC PANEL
Albumin: 3.6
BUN: 19
Creatinine, Ser: 0.71
Total Protein: 8.1

## 2011-03-30 LAB — BASIC METABOLIC PANEL
BUN: 4 — ABNORMAL LOW
CO2: 25
CO2: 29
Chloride: 105
GFR calc non Af Amer: >60
GFR calc non Af Amer: >60
Glucose, Bld: 114 — ABNORMAL HIGH
Glucose, Bld: 128 — ABNORMAL HIGH
Potassium: 4.1
Potassium: 5.3 — ABNORMAL HIGH
Sodium: 136

## 2011-03-30 LAB — CBC
HCT: 32.2 — ABNORMAL LOW
HCT: 32.2 — ABNORMAL LOW
HCT: 39.8
Hemoglobin: 10.8 — ABNORMAL LOW
MCHC: 33.4
MCV: 84.6
MCV: 85.1
Platelets: 210
Platelets: 219
Platelets: 280
RDW: 13.9
RDW: 14
RDW: 14
RDW: 14.3

## 2011-03-30 LAB — URINE CULTURE
Colony Count: NO GROWTH
Culture: NO GROWTH

## 2011-03-30 LAB — URINALYSIS, ROUTINE W REFLEX MICROSCOPIC
Bilirubin Urine: NEGATIVE
Protein, ur: NEGATIVE
Urobilinogen, UA: 0.2

## 2011-03-30 LAB — DIFFERENTIAL
Lymphocytes Relative: 22
Lymphs Abs: 1.6
Monocytes Absolute: 0.7
Monocytes Relative: 10
Neutro Abs: 4.8

## 2011-03-30 LAB — URINE MICROSCOPIC-ADD ON

## 2011-03-30 LAB — PROTIME-INR
INR: 0.9
Prothrombin Time: 12.5

## 2011-03-30 LAB — APTT: aPTT: 24

## 2011-03-30 LAB — TYPE AND SCREEN

## 2011-04-11 ENCOUNTER — Other Ambulatory Visit: Payer: Self-pay | Admitting: Family Medicine

## 2011-04-11 DIAGNOSIS — Z1231 Encounter for screening mammogram for malignant neoplasm of breast: Secondary | ICD-10-CM

## 2011-05-04 ENCOUNTER — Ambulatory Visit
Admission: RE | Admit: 2011-05-04 | Discharge: 2011-05-04 | Disposition: A | Discharge status: Home / Self Care | Payer: BC Managed Care – PPO | Source: Ambulatory Visit | Attending: Family Medicine | Admitting: Family Medicine

## 2011-05-04 DIAGNOSIS — Z1231 Encounter for screening mammogram for malignant neoplasm of breast: Secondary | ICD-10-CM

## 2011-12-18 ENCOUNTER — Other Ambulatory Visit: Payer: Self-pay | Admitting: Family Medicine

## 2011-12-18 DIAGNOSIS — E049 Nontoxic goiter, unspecified: Secondary | ICD-10-CM

## 2011-12-20 ENCOUNTER — Ambulatory Visit
Admission: RE | Admit: 2011-12-20 | Discharge: 2011-12-20 | Disposition: A | Discharge status: Home / Self Care | Payer: BC Managed Care – PPO | Source: Ambulatory Visit | Attending: Family Medicine | Admitting: Family Medicine

## 2011-12-20 DIAGNOSIS — E049 Nontoxic goiter, unspecified: Secondary | ICD-10-CM

## 2012-04-10 ENCOUNTER — Other Ambulatory Visit: Payer: Self-pay | Admitting: Family Medicine

## 2012-04-10 DIAGNOSIS — Z1231 Encounter for screening mammogram for malignant neoplasm of breast: Secondary | ICD-10-CM

## 2012-05-06 ENCOUNTER — Ambulatory Visit
Admission: RE | Admit: 2012-05-06 | Discharge: 2012-05-06 | Disposition: A | Discharge status: Home / Self Care | Payer: BC Managed Care – PPO | Source: Ambulatory Visit | Attending: Family Medicine | Admitting: Family Medicine

## 2012-05-06 DIAGNOSIS — Z1231 Encounter for screening mammogram for malignant neoplasm of breast: Secondary | ICD-10-CM

## 2013-02-11 ENCOUNTER — Other Ambulatory Visit: Payer: Self-pay | Admitting: Chiropractic Medicine

## 2013-02-11 ENCOUNTER — Ambulatory Visit
Admission: RE | Admit: 2013-02-11 | Discharge: 2013-02-11 | Disposition: A | Discharge status: Home / Self Care | Payer: BC Managed Care – PPO | Source: Ambulatory Visit | Attending: Chiropractic Medicine | Admitting: Chiropractic Medicine

## 2013-02-11 DIAGNOSIS — M545 Low back pain, unspecified: Secondary | ICD-10-CM

## 2013-03-27 ENCOUNTER — Other Ambulatory Visit: Payer: Self-pay

## 2013-03-27 DIAGNOSIS — Z1231 Encounter for screening mammogram for malignant neoplasm of breast: Secondary | ICD-10-CM

## 2013-04-18 ENCOUNTER — Ambulatory Visit: Payer: Self-pay

## 2013-04-21 ENCOUNTER — Ambulatory Visit
Admission: RE | Admit: 2013-04-21 | Discharge: 2013-04-21 | Disposition: A | Discharge status: Home / Self Care | Payer: BC Managed Care – PPO | Source: Ambulatory Visit

## 2013-04-21 DIAGNOSIS — Z1231 Encounter for screening mammogram for malignant neoplasm of breast: Secondary | ICD-10-CM

## 2013-04-25 ENCOUNTER — Ambulatory Visit (INDEPENDENT_AMBULATORY_CARE_PROVIDER_SITE_OTHER): Payer: BC Managed Care – PPO

## 2013-04-25 VITALS — BP 116/86 | HR 88 | Resp 16 | Ht 69.0 in | Wt 250.0 lb

## 2013-04-25 DIAGNOSIS — M76821 Posterior tibial tendinitis, right leg: Secondary | ICD-10-CM

## 2013-04-25 DIAGNOSIS — M722 Plantar fascial fibromatosis: Secondary | ICD-10-CM

## 2013-04-25 DIAGNOSIS — M76829 Posterior tibial tendinitis, unspecified leg: Secondary | ICD-10-CM

## 2013-04-25 MED ORDER — MELOXICAM 15 MG PO TABS: 15.0000 mg | ORAL_TABLET | Freq: Every day | ORAL | Status: DC

## 2013-04-25 NOTE — Patient Instructions (Signed)

## 2013-04-25 NOTE — Progress Notes (Signed)
  Subjective:    Patient ID: [name], female    DOB: 1961-05-07, 52 y.o.   MRN: [id number]  HPI Comments: '' THE RT FOOT STILL HURTING FOR 6 MONTHS AND IS NOT GETTING BETTER.''   Foot Pain This is a recurrent problem. The current episode started more than 1 year ago. The problem occurs constantly. The problem has been unchanged. Nothing aggravates the symptoms. She has tried nothing for the symptoms. The treatment provided no relief.   patient is been wearing orthotics as instructed has approxi-50% improvement in pain intensity and only intermittent pain and not constant pain she had previously.    Review of Systems  Constitutional: Negative.   HENT: Negative.   Eyes: Positive for visual disturbance.  Respiratory: Negative.   Cardiovascular: Negative.   Gastrointestinal: Negative.   Endocrine: Negative.   Genitourinary: Negative.   Musculoskeletal: Negative.   Skin: Negative.   Allergic/Immunologic: Negative.   Neurological: Negative.   Hematological: Negative.   Psychiatric/Behavioral: Negative.        Objective:   Physical Exam Neurovascular status is intact with pedal pulses palpable DP +2/4 PT +2/4 bilateral. Capillary refill time 3 seconds all digits skin temperature warm. No edema rubor pallor or varicosities noted. Slight edema the medial ankle right. There is still tenderness on palpation medial band plantar fascia right and medial malleoli are area along the posterior tibial tendon distribution right. Patient wearing orthotics as instructed and has significant decrease in pain intensity. Has not been taking Mobic as she has run out. At today's visit is now wearing orthotics or athletic shoe.     Assessment & Plan:  Plantar fasciitis/heel spur syndrome as well as posterior tibial tendinitis improving with the use of orthoses in stable shoes. Patient is instructed continue with appropriate orthotics new prescription for meloxicam is issued as well. May maintain ice  on an as-needed basis followup in the future and as-needed basis if there is any flare up or exacerbation.  Alvan Dame DPM

## 2013-04-30 ENCOUNTER — Telehealth: Payer: Self-pay | Admitting: *Deleted

## 2013-04-30 NOTE — Telephone Encounter (Signed)
Pt states left her prescription here on 04/25/2013.  I checked Dr Ralene Cork' s orders and called the Mobic into [facility] on [phone] as requested by pt.

## 2013-08-08 ENCOUNTER — Emergency Department (HOSPITAL_COMMUNITY)
Admission: EM | Admit: 2013-08-08 | Discharge: 2013-08-08 | Disposition: A | Discharge status: Home / Self Care | Payer: BC Managed Care – PPO | Attending: Emergency Medicine | Admitting: Emergency Medicine

## 2013-08-08 ENCOUNTER — Encounter (HOSPITAL_COMMUNITY): Payer: Self-pay | Admitting: Emergency Medicine

## 2013-08-08 ENCOUNTER — Emergency Department (HOSPITAL_COMMUNITY): Payer: BC Managed Care – PPO

## 2013-08-08 DIAGNOSIS — Z87891 Personal history of nicotine dependence: Secondary | ICD-10-CM | POA: Insufficient documentation

## 2013-08-08 DIAGNOSIS — Z79899 Other long term (current) drug therapy: Secondary | ICD-10-CM | POA: Insufficient documentation

## 2013-08-08 DIAGNOSIS — R079 Chest pain, unspecified: Secondary | ICD-10-CM

## 2013-08-08 LAB — CBC WITH DIFFERENTIAL/PLATELET
BASOS ABS: 0 10*3/uL (ref 0.0–0.1)
BASOS PCT: 0 % (ref 0–1)
Eosinophils Absolute: 0.2 10*3/uL (ref 0.0–0.7)
Eosinophils Relative: 2 % (ref 0–5)
HEMATOCRIT: 36.6 % (ref 36.0–46.0)
HEMOGLOBIN: 12.2 g/dL (ref 12.0–15.0)
LYMPHS PCT: 39 % (ref 12–46)
Lymphs Abs: 2.7 10*3/uL (ref 0.7–4.0)
MCH: 28 pg (ref 26.0–34.0)
MCHC: 33.3 g/dL (ref 30.0–36.0)
MCV: 84.1 fL (ref 78.0–100.0)
MONO ABS: 0.6 10*3/uL (ref 0.1–1.0)
Monocytes Relative: 8 % (ref 3–12)
NEUTROS ABS: 3.5 10*3/uL (ref 1.7–7.7)
NEUTROS PCT: 50 % (ref 43–77)
Platelets: 246 10*3/uL (ref 150–400)
RBC: 4.35 MIL/uL (ref 3.87–5.11)
RDW: 13.6 % (ref 11.5–15.5)
WBC: 6.9 10*3/uL (ref 4.0–10.5)

## 2013-08-08 LAB — POCT I-STAT TROPONIN I
TROPONIN I, POC: 0 ng/mL (ref 0.00–0.08)
TROPONIN I, POC: 0 ng/mL (ref 0.00–0.08)

## 2013-08-08 LAB — BASIC METABOLIC PANEL
BUN: 11 mg/dL (ref 6–23)
CHLORIDE: 100 meq/L (ref 96–112)
CO2: 26 meq/L (ref 19–32)
Calcium: 9.1 mg/dL (ref 8.4–10.5)
Creatinine, Ser: 0.69 mg/dL (ref 0.50–1.10)
GFR calc non Af Amer: >90 mL/min (ref >90)
Glucose, Bld: 92 mg/dL (ref 70–99)
POTASSIUM: 4.4 meq/L (ref 3.7–5.3)
Sodium: 138 mEq/L (ref 137–147)

## 2013-08-08 NOTE — ED Provider Notes (Signed)
CSN: [id number]     Arrival date & time 08/08/13  2020 History   First MD Initiated Contact with Patient 08/08/13 2031     Chief Complaint  Patient presents with  . Chest Pain   (Consider location/radiation/quality/duration/timing/severity/associated sxs/prior Treatment) HPI Comments: Patient is a 53 year old female with no significant past medical history who presents with an episode of chest pain that occurred earlier this evening. Patient reports she was shopping at [facility] and had sudden onset of chest pain in her central chest. The pain was shooting and severe with radiation up to her neck. The pain lasted 1-2 minutes and resolved without intervention. The pain came back for 1-2 minutes and then resolved indefinitely. Patient reports having nausea and some sweating earlier in the day that was not associated with the chest pain. Patient has never had this previously. She denies any family history of chest pain. She "feels fine" now and has no complaints. She came to the ED because the severity of the pain made her nervous.    History reviewed. No pertinent past medical history. Past Surgical History  Procedure Laterality Date  . Total knee arthroplasty    . Revision total knee arthroplasty    . Abdominal hysterectomy    . Breast surgery    . Orif radial fracture     History reviewed. No pertinent family history. History  Substance Use Topics  . Smoking status: Former Research scientist (life sciences)  . Smokeless tobacco: Not on file  . Alcohol Use: Yes   OB History   Grav Para Term Preterm Abortions TAB SAB Ect Mult Living                 Review of Systems  Constitutional: Negative for fever, chills and fatigue.  HENT: Negative for trouble swallowing.   Eyes: Negative for visual disturbance.  Respiratory: Negative for shortness of breath.   Cardiovascular: Positive for chest pain. Negative for palpitations.  Gastrointestinal: Negative for nausea, vomiting, abdominal pain and diarrhea.  Genitourinary:  Negative for dysuria and difficulty urinating.  Musculoskeletal: Negative for arthralgias and neck pain.  Skin: Negative for color change.  Neurological: Negative for dizziness and weakness.  Psychiatric/Behavioral: Negative for dysphoric mood.    Allergies  Codeine  Home Medications   Current Outpatient Rx  Name  Route  Sig  Dispense  Refill  . Multiple Vitamins-Minerals (MULTIVITAMIN PO)   Oral   Take 1 tablet by mouth daily.          BP 144/85  Pulse 77  Temp(Src) 97.6 F (36.4 C) (Oral)  Resp 20  Ht 5' 8.5" (1.74 m)  Wt 253 lb (114.76 kg)  BMI 37.90 kg/m2  SpO2 100% Physical Exam  Nursing note and vitals reviewed. Constitutional: She appears well-developed and well-nourished. No distress.  HENT:  Head: Normocephalic and atraumatic.  Eyes: Conjunctivae and EOM are normal.  Neck: Normal range of motion.  Cardiovascular: Normal rate and regular rhythm.  Exam reveals no gallop and no friction rub.   No murmur heard. Pulmonary/Chest: Effort normal and breath sounds normal. She has no wheezes. She has no rales. She exhibits no tenderness.  Abdominal: Soft. She exhibits no distension. There is no tenderness. There is no rebound.  Musculoskeletal: Normal range of motion.  Neurological: She is alert.  Speech is goal-oriented. Moves limbs without ataxia.   Skin: Skin is warm and dry.  Psychiatric: She has a normal mood and affect. Her behavior is normal.    ED Course  Procedures (  including critical care time) Labs Review Labs Reviewed  CBC WITH DIFFERENTIAL  BASIC METABOLIC PANEL  POCT I-STAT TROPONIN I  POCT I-STAT TROPONIN I   Imaging Review Dg Chest 2 View  08/08/2013   CLINICAL DATA:  Chest pain, shortness of breath.  EXAM: CHEST  2 VIEW  COMPARISON:  11/16/2010  FINDINGS: Heart and mediastinal contours are within normal limits. No focal opacities or effusions. No acute bony abnormality.  IMPRESSION: No active cardiopulmonary disease.   Electronically Signed    By: Rolm Baptise M.D.   On: 08/08/2013 21:21    EKG Interpretation   None       MDM   1. Chest pain     10:04 PM Labs and troponin unremarkable for acute changes. Vitals stable and patient afebrile. EKG unremarkable for acute changes. HEART score is 2, making the patient low risk for life threatening cardiac event.   11:35 PM Patient's second troponin unremarkable for elevation. Patient will be discharged with recommended follow up with her PCP. Vitals stable and patient afebrile. Patient instructed to return to the ED with worsening or concerning symptoms.   Alvina Chou, [place] 08/08/13 2336

## 2013-08-08 NOTE — ED Notes (Signed)
Per pt, pt was at [facility] shopping when she had a sudden onset of substernal chest pain with radiation to her neck; pt sts pain was sharp and "twisting" in nature; pain 8/10.  Pt did not take ASA; pt sts this is the first time she has had any of this type of pain. Pt reports nausea and diaphoresis "i have hot flashes so I don't know if that is why I was sweating".

## 2013-08-08 NOTE — ED Notes (Signed)
Patient transported to XR. 

## 2013-08-08 NOTE — Discharge Instructions (Signed)
Your test results today are negative. Refer to attached documents for more information. Follow up with your doctor for further evaluation. Return to the ED with worsening or concerning symptoms.

## 2013-08-09 NOTE — ED Provider Notes (Signed)
Medical screening examination/treatment/procedure(s) were performed by non-physician practitioner and as supervising physician I was immediately available for consultation/collaboration.  EKG Interpretation    Date/Time:  Friday August 08 2013 20:25:49 EST Ventricular Rate:  73 PR Interval:  175 QRS Duration: 92 QT Interval:  391 QTC Calculation: 431 R Axis:   31 Text Interpretation:  Sinus rhythm Confirmed by [place]  MD, Halim Surrette ([id number]) on 08/08/2013 10:31:20 PM              Elmer Sow, MD 08/09/13 0005

## 2014-02-16 ENCOUNTER — Emergency Department (INDEPENDENT_AMBULATORY_CARE_PROVIDER_SITE_OTHER)
Admission: EM | Admit: 2014-02-16 | Discharge: 2014-02-16 | Disposition: A | Discharge status: Home / Self Care | Payer: Worker's Compensation | Source: Home / Self Care | Attending: Family Medicine | Admitting: Family Medicine

## 2014-02-16 ENCOUNTER — Encounter (HOSPITAL_COMMUNITY): Payer: Self-pay | Admitting: Emergency Medicine

## 2014-02-16 ENCOUNTER — Emergency Department (INDEPENDENT_AMBULATORY_CARE_PROVIDER_SITE_OTHER): Payer: Worker's Compensation

## 2014-02-16 DIAGNOSIS — S93609A Unspecified sprain of unspecified foot, initial encounter: Secondary | ICD-10-CM

## 2014-02-16 DIAGNOSIS — IMO0002 Reserved for concepts with insufficient information to code with codable children: Secondary | ICD-10-CM | POA: Diagnosis not present

## 2014-02-16 DIAGNOSIS — S93509A Unspecified sprain of unspecified toe(s), initial encounter: Secondary | ICD-10-CM

## 2014-02-16 MED ORDER — DICLOFENAC SODIUM 75 MG PO TBEC: 75.0000 mg | DELAYED_RELEASE_TABLET | Freq: Two times a day (BID) | ORAL | Status: DC

## 2014-02-16 NOTE — ED Provider Notes (Signed)
CSN: [id number]     Arrival date & time 02/16/14  1708 History   First MD Initiated Contact with Patient 02/16/14 1836     Chief Complaint  Patient presents with  . Toe Injury    Left foot, second toe    (Consider location/radiation/quality/duration/timing/severity/associated sxs/prior Treatment) HPI  L. Long toe injured 2 wks ago. Occurred when walking in a home. Tripped over power cord. Heard a pop. Immediately painful and swollen. Epsum salt soaks and elevation w/o much benefit. No medications. Pain is worse w/ ambulation. movementa nd sensation intact.    History reviewed. No pertinent past medical history. Past Surgical History  Procedure Laterality Date  . Total knee arthroplasty    . Revision total knee arthroplasty    . Abdominal hysterectomy    . Breast surgery    . Orif radial fracture     No family history on file. History  Substance Use Topics  . Smoking status: Former Research scientist (life sciences)  . Smokeless tobacco: Not on file  . Alcohol Use: Yes   OB History   Grav Para Term Preterm Abortions TAB SAB Ect Mult Living                 Review of Systems  Allergies  Codeine  Home Medications   Prior to Admission medications   Medication Sig Start Date End Date Taking? Authorizing Provider  diclofenac (VOLTAREN) 75 MG EC tablet Take 1 tablet (75 mg total) by mouth 2 (two) times daily. 02/16/14   Waldemar Dickens, MD  Multiple Vitamins-Minerals (MULTIVITAMIN PO) Take 1 tablet by mouth daily.    Historical Provider, MD   BP 144/94  Pulse 101  Temp(Src) 97.6 F (36.4 C) (Oral)  Resp 16  SpO2 95% Physical Exam  ED Course  Procedures (including critical care time) Labs Review Labs Reviewed - No data to display  Imaging Review Dg Toe 2nd Left  02/16/2014   CLINICAL DATA:  Second toe pain after injury 2 weeks ago.  EXAM: LEFT SECOND TOE  COMPARISON:  10/30/2012  FINDINGS: There is no evidence of fracture or dislocation. There is no evidence of arthropathy or other focal bone  abnormality. Soft tissues are unremarkable.  There is a small density lateral to the distal phalanx of the third digit. This may represent site of prior trauma or radiopaque foreign body.  IMPRESSION: 1. No acute abnormality in the second digit. 2. Site of prior trauma or small foreign body of the third digit. See above.   Electronically Signed   By: Shon Hale M.D.   On: 02/16/2014 19:07     MDM   1. Toe sprain, initial encounter    No fracture Diclofenac, buddy tape, ice, and rest as needed.  Post op shoe provided to limite ROM adn further trauma to toe as pt walks a lot every day Precautions given and all questions answered  Linna Darner, MD Family Medicine 02/16/2014, 7:37 PM      Waldemar Dickens, MD 02/16/14 [id number]

## 2014-02-16 NOTE — Discharge Instructions (Signed)
You sprained your toe There is no sign of fracture This will take another couple of weeks to fully heal. Please wear the post op shoe as needed. Please buddy tape the toe as needed This should not limit your ability to function  Please use the diclofenac as needed for pain and swelling   Buddy Taping of Toes We have taped your toes together to keep them from moving. This is called "buddy taping" since we used a part of your own body to keep the injured part still. We placed soft padding between your toes to keep them from rubbing against each other. Buddy taping will help with healing and to reduce pain. Keep your toes buddy taped together for as long as directed by your caregiver. HOME CARE INSTRUCTIONS   Raise your injured area above the level of your heart while sitting or lying down. Prop it up with pillows.  An ice pack used every twenty minutes, while awake, for the first one to two days may be helpful. Put ice in a plastic bag and put a towel between the bag and your skin.  Watch for signs that the taping is too tight. These signs may be:  Numbness of your taped toes.  Coolness of your taped toes.  Color change in the area beyond the tape.  Increased pain.  If you have any of these signs, loosen or rewrap the tape. If you need to loosen or rewrap the buddy tape, make sure you use the padding again. SEEK IMMEDIATE MEDICAL CARE IF:   You have worse pain, swelling, inflammation (soreness), drainage or bleeding after you rewrap the tape.  Any new problems occur. MAKE SURE YOU:   Understand these instructions.  Will watch your condition.  Will get help right away if you are not doing well or get worse. Document Released: 03/23/2004 Document Revised: 09/11/2011 Document Reviewed: 06/16/2008 [facility] Patient Information 2015 [place], [place]. This information is not intended to replace advice given to you by your health care provider. Make sure you discuss any questions you  have with your health care provider.

## 2014-02-16 NOTE — ED Notes (Signed)
Patient tripped over clutter at clients home and injured toe. Left foot, second toe injured on 02/05/14. Patient complains of constant, throbbing pain since injury.

## 2014-03-26 ENCOUNTER — Other Ambulatory Visit: Payer: Self-pay

## 2014-03-26 DIAGNOSIS — Z1231 Encounter for screening mammogram for malignant neoplasm of breast: Secondary | ICD-10-CM

## 2014-04-22 ENCOUNTER — Ambulatory Visit
Admission: RE | Admit: 2014-04-22 | Discharge: 2014-04-22 | Disposition: A | Discharge status: Home / Self Care | Payer: BC Managed Care – PPO | Source: Ambulatory Visit

## 2014-04-22 DIAGNOSIS — Z1231 Encounter for screening mammogram for malignant neoplasm of breast: Secondary | ICD-10-CM

## 2015-03-26 ENCOUNTER — Emergency Department (INDEPENDENT_AMBULATORY_CARE_PROVIDER_SITE_OTHER)
Admission: EM | Admit: 2015-03-26 | Discharge: 2015-03-26 | Disposition: A | Discharge status: Home / Self Care | Payer: 59 | Source: Home / Self Care | Attending: Emergency Medicine | Admitting: Emergency Medicine

## 2015-03-26 ENCOUNTER — Encounter (HOSPITAL_COMMUNITY): Payer: Self-pay | Admitting: Emergency Medicine

## 2015-03-26 DIAGNOSIS — M25562 Pain in left knee: Secondary | ICD-10-CM

## 2015-03-26 MED ORDER — PREDNISONE 50 MG PO TABS: ORAL_TABLET | ORAL | Status: DC

## 2015-03-26 NOTE — ED Notes (Signed)
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 Sx today include swelling and bruising Steady gait Alert and oriented x4... No acute distress.

## 2015-03-26 NOTE — Discharge Instructions (Signed)
You have likely torn the meniscus in your left knee. Please continue to wear the brace and ice as often as you can. Take prednisone daily for 5 days. Please follow-up with your orthopedic surgeon for additional management.

## 2015-03-26 NOTE — ED Provider Notes (Signed)
CSN: [id number]     Arrival date & time 03/26/15  1841 History   First MD Initiated Contact with Patient 03/26/15 1959     Chief Complaint  Patient presents with  . Knee Injury   (Consider location/radiation/quality/duration/timing/severity/associated sxs/prior Treatment) HPI  She is a 54 year old woman here for evaluation of left knee injury. 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 She states she landed hard on her right knee. She did land a little bit on her left knee as well. Today, she primarily has pain in the left knee. It is throughout the knee, but worse in the lateral aspect. She had a large bruise across the anterior and lateral knee that she states is improving. She has been wearing a knee brace. She has also been applying ice. She does report some locking of the knee. Twisting movements are particularly painful.  She has been taking Advil with mild improvement.  History reviewed. No pertinent past medical history. Past Surgical History  Procedure Laterality Date  . Total knee arthroplasty    . Revision total knee arthroplasty    . Abdominal hysterectomy    . Breast surgery    . Orif radial fracture     No family history on file. Social History  Substance Use Topics  . Smoking status: Former Research scientist (life sciences)  . Smokeless tobacco: None  . Alcohol Use: Yes   OB History    No data available     Review of Systems As in history of present illness Allergies  Codeine  Home Medications   Prior to Admission medications   Medication Sig Start Date End Date Taking? Authorizing Provider  diclofenac (VOLTAREN) 75 MG EC tablet Take 1 tablet (75 mg total) by mouth 2 (two) times daily. 02/16/14   Waldemar Dickens, MD  Multiple Vitamins-Minerals (MULTIVITAMIN PO) Take 1 tablet by mouth daily.    Historical Provider, MD  predniSONE (DELTASONE) 50 MG tablet Take 1 pill daily for 5 days. 03/26/15   Melony Overly, MD   Meds Ordered and Administered this Visit  Medications - No data to display  BP 146/81 mmHg  Pulse 89  Temp(Src) 97.7 F (36.5 C) (Oral)  Resp 16  SpO2 97% No data found.   Physical Exam  Constitutional: She is oriented to person, place, and time. She appears well-developed and well-nourished. No distress.  Cardiovascular: Normal rate.   Pulmonary/Chest: Effort normal.  Musculoskeletal:  Right knee: There is a healing bruise at the inferior and lateral aspect. She is tender at the lateral joint line. There is a moderate joint effusion. Positive McMurray's. No joint laxity.  Neurological: She is alert and oriented to person, place, and time.    ED Course  Procedures (including critical care time)  Labs Review Labs Reviewed - No data to display  Imaging Review No results found.    MDM   1. Left knee pain    I suspect she tore her meniscus. She will continue to wear the brace and apply ice. We'll do a course of prednisone to see if we can decrease the swelling and inflammation in the knee. Recommended follow-up with her orthopedic surgeon, Dr. Darryl Lent, for additional management.    Melony Overly, MD 03/26/15 2041

## 2015-03-31 ENCOUNTER — Other Ambulatory Visit: Payer: Self-pay

## 2015-03-31 DIAGNOSIS — Z1231 Encounter for screening mammogram for malignant neoplasm of breast: Secondary | ICD-10-CM

## 2015-04-26 ENCOUNTER — Ambulatory Visit
Admission: RE | Admit: 2015-04-26 | Discharge: 2015-04-26 | Disposition: A | Discharge status: Home / Self Care | Payer: 59 | Source: Ambulatory Visit

## 2015-04-26 DIAGNOSIS — Z1231 Encounter for screening mammogram for malignant neoplasm of breast: Secondary | ICD-10-CM

## 2015-05-10 DIAGNOSIS — G4733 Obstructive sleep apnea (adult) (pediatric): Secondary | ICD-10-CM | POA: Insufficient documentation

## 2015-05-10 DIAGNOSIS — K219 Gastro-esophageal reflux disease without esophagitis: Secondary | ICD-10-CM | POA: Insufficient documentation

## 2015-10-13 ENCOUNTER — Encounter: Payer: Self-pay | Admitting: Podiatry

## 2015-10-13 ENCOUNTER — Ambulatory Visit (INDEPENDENT_AMBULATORY_CARE_PROVIDER_SITE_OTHER): Payer: BLUE CROSS/BLUE SHIELD | Admitting: Podiatry

## 2015-10-13 DIAGNOSIS — M76829 Posterior tibial tendinitis, unspecified leg: Secondary | ICD-10-CM | POA: Diagnosis not present

## 2015-10-13 MED ORDER — TRIAMCINOLONE ACETONIDE 10 MG/ML IJ SUSP
10.0000 mg | Freq: Once | INTRAMUSCULAR | Status: AC
  Administered 2015-10-13: 10 mg

## 2015-10-14 NOTE — Progress Notes (Signed)
Subjective:     Patient ID: [name], female   DOB: October 12, 1960, 55 y.o.   MRN: [id number]  HPI patient states I developed a lot of pain in my ankles of both feet and it makes it hard for me to walk comfortably. I've had this pain in the past but I did well for about a year but I have not been wearing my orthotics recently as they are not fitting well   Review of Systems     Objective:   Physical Exam Neurovascular status intact muscle strength was adequate and posterior tibial function found to be within normal limits. Patient does have depression of the arch and quite a bit of discomfort in the posterior tibial insertion into the navicular bilateral with fluid buildup and mild to moderate swelling noted     Assessment:     Posterior tibial tendinitis bilateral with significant structural changes around the ankle and foot     Plan:     H&P and x-rays reviewed and today I injected the posterior tibial insertion bilateral 3 mg Kenalog 5 mg Xylocaine and applied fascial brace to each foot and ankle. I gave instructions on reduced activity and wearing supportive shoes and not going barefoot and reappoint 2 weeks and bring her orthotics and at that time   X-ray report indicates that there is depression of the arch bilateral with collapse medial longitudinal arch occurring

## 2015-10-26 ENCOUNTER — Other Ambulatory Visit: Payer: Self-pay | Admitting: Family Medicine

## 2015-10-26 ENCOUNTER — Other Ambulatory Visit (HOSPITAL_COMMUNITY)
Admission: RE | Admit: 2015-10-26 | Discharge: 2015-10-26 | Disposition: A | Discharge status: Home / Self Care | Payer: Self-pay | Source: Ambulatory Visit | Attending: Family Medicine | Admitting: Family Medicine

## 2015-10-26 DIAGNOSIS — Z01419 Encounter for gynecological examination (general) (routine) without abnormal findings: Secondary | ICD-10-CM | POA: Insufficient documentation

## 2015-10-27 ENCOUNTER — Encounter: Payer: Self-pay | Admitting: Podiatry

## 2015-10-27 ENCOUNTER — Ambulatory Visit (INDEPENDENT_AMBULATORY_CARE_PROVIDER_SITE_OTHER): Payer: BLUE CROSS/BLUE SHIELD | Admitting: Podiatry

## 2015-10-27 DIAGNOSIS — M76829 Posterior tibial tendinitis, unspecified leg: Secondary | ICD-10-CM | POA: Diagnosis not present

## 2015-10-27 NOTE — Progress Notes (Signed)
Subjective:     Patient ID: [name], female   DOB: 1960/09/07, 55 y.o.   MRN: [id number]  HPI patient states my right heel is doing really well but the left was given me a lot of problems on the inside and it did not respond at all to medication   Review of Systems     Objective:   Physical Exam Neurovascular status intact muscle strength adequate with exquisite discomfort in the posterior tibial tendon left with continued edema in the medial ankle. The right has responded very well and the left is been hurting for around 6 weeks    Assessment:     Acute posterior tibial tendinitis left    Plan:     Reviewed condition and discussed continued conservative care. If symptoms do not get better we will need to consider MRI with the possibility of tear of the posterior tibial tendon. Today I placed in an air fracture walker to completely immobilize and scanned for custom orthotics to reduce stress against the posterior tibial tendon. Reappoint 3 weeks or earlier if needed

## 2015-10-28 LAB — CYTOLOGY - PAP

## 2015-11-17 ENCOUNTER — Ambulatory Visit (INDEPENDENT_AMBULATORY_CARE_PROVIDER_SITE_OTHER): Payer: BLUE CROSS/BLUE SHIELD | Admitting: Podiatry

## 2015-11-17 ENCOUNTER — Encounter: Payer: Self-pay | Admitting: Podiatry

## 2015-11-17 DIAGNOSIS — M76829 Posterior tibial tendinitis, unspecified leg: Secondary | ICD-10-CM | POA: Diagnosis not present

## 2015-11-17 DIAGNOSIS — T148 Other injury of unspecified body region: Secondary | ICD-10-CM | POA: Diagnosis not present

## 2015-11-17 DIAGNOSIS — T148XXA Other injury of unspecified body region, initial encounter: Secondary | ICD-10-CM

## 2015-11-17 NOTE — Patient Instructions (Signed)

## 2015-11-17 NOTE — Progress Notes (Signed)
Subjective:     Patient ID: [name], female   DOB: Nov 15, 1960, 55 y.o.   MRN: [id number]  HPI patient states she still having a lot of pain in her left ankle and she was not able to tolerate the boot. States the right heel is doing well   Review of Systems     Objective:   Physical Exam Neurovascular status intact with intense discomfort in the left medial ankle was swelling and possible dysfunction of the posterior tibial tendon with flatness of the arch noted    Assessment:     Possibility for interstitial tear of the left posterior tibial tendon    Plan:     Reviewed condition at great length and discussed treatment options and at this point orthotics were dispensed which do help to lift her arch up but still has a lot of pain in her left ankle. Due to the swelling and the pain we are sending her for an MRI at this time

## 2015-11-17 NOTE — Progress Notes (Signed)
   Subjective:    Patient ID: [name], female    DOB: 09-22-1960, 55 y.o.   MRN: [id number]  HPI "It's still hurting like crazy.  That boot made it hurt worse.  I wore it about 3-4 day.  I had to stop wearing it."  Patient presents to pick up orthotics, instructions were given.    Review of Systems     Objective:   Physical Exam        Assessment & Plan:

## 2015-11-18 ENCOUNTER — Telehealth: Payer: Self-pay | Admitting: *Deleted

## 2015-11-18 NOTE — Telephone Encounter (Addendum)
Orders to [name] for [employer].  [employer] [place] APPROVED LEFT ANKLE MRI [id number], AUTHORIZATION ORDER# [id number], VALID 11/18/2015 - 12/17/2015. Faxed to [facility]. 12/23/2015-Pt called for MRI results.  I told pt Dr. Paulla Dolly would like to discuss the results with her.  Transferred pt pt to schedulers. 01/27/2016-Dr. Regal request MRI disc to be sent to [facility].  Mailed to [facility].

## 2015-12-04 ENCOUNTER — Ambulatory Visit
Admission: RE | Admit: 2015-12-04 | Discharge: 2015-12-04 | Disposition: A | Discharge status: Home / Self Care | Payer: BLUE CROSS/BLUE SHIELD | Source: Ambulatory Visit | Attending: Podiatry | Admitting: Podiatry

## 2015-12-04 ENCOUNTER — Encounter: Payer: Self-pay | Admitting: Podiatry

## 2015-12-04 DIAGNOSIS — M76829 Posterior tibial tendinitis, unspecified leg: Secondary | ICD-10-CM

## 2015-12-04 DIAGNOSIS — T148XXA Other injury of unspecified body region, initial encounter: Secondary | ICD-10-CM

## 2015-12-29 ENCOUNTER — Ambulatory Visit (INDEPENDENT_AMBULATORY_CARE_PROVIDER_SITE_OTHER): Payer: BLUE CROSS/BLUE SHIELD | Admitting: Podiatry

## 2015-12-29 DIAGNOSIS — M722 Plantar fascial fibromatosis: Secondary | ICD-10-CM | POA: Diagnosis not present

## 2015-12-29 DIAGNOSIS — M76829 Posterior tibial tendinitis, unspecified leg: Secondary | ICD-10-CM | POA: Diagnosis not present

## 2015-12-29 MED ORDER — TRIAMCINOLONE ACETONIDE 10 MG/ML IJ SUSP
10.0000 mg | Freq: Once | INTRAMUSCULAR | Status: AC
  Administered 2015-12-29: 10 mg

## 2015-12-30 NOTE — Progress Notes (Signed)
Subjective:     Patient ID: [name], female   DOB: 1961/01/29, 55 y.o.   MRN: [id number]  HPI patient states it hurts on the arch and now it really also hurting on the bottom of the heel   Review of Systems     Objective:   Physical Exam Neurovascular status intact muscle strength adequate with exquisite discomfort in the plantar aspect of the left heel and continued discomfort in the arch left with swelling despite negative MRI    Assessment:     Appears to be posterior tibial synovitis and plantar fasciitis left that's acute when pressed right now    Plan:     Reviewed both conditions and I did dispense and ankle compression stocking to try to reduce the swelling in the ankle and I injected the left plantar fashion 3 mg Kenalog 5 mg Xylocaine. Patient is encouraged to wear the boot even though it's trouble for her to do but I like her to try and she'll be seen back to recheck

## 2016-01-26 ENCOUNTER — Ambulatory Visit (INDEPENDENT_AMBULATORY_CARE_PROVIDER_SITE_OTHER): Payer: BLUE CROSS/BLUE SHIELD | Admitting: Podiatry

## 2016-01-26 DIAGNOSIS — M722 Plantar fascial fibromatosis: Secondary | ICD-10-CM

## 2016-01-26 DIAGNOSIS — M76829 Posterior tibial tendinitis, unspecified leg: Secondary | ICD-10-CM

## 2016-01-27 NOTE — Progress Notes (Signed)
Subjective:     Patient ID: [name], female   DOB: Jun 19, 1961, 55 y.o.   MRN: [id number]  HPI patient states that she is still getting swelling of the left ankle and that it still sore and makes it hard for her to be ambulatory. She states that it still hurts more in the arch than it does in the ankle   Review of Systems     Objective:   Physical Exam Neurovascular status intact muscle strength adequate range of motion was reduced in the subtalar joint left were to appears a lot of pain in the medial ankle which is probably causing her to have inflammation and fluid buildup with what still appears to be some kind of a pathology the posterior tibial tendon with possibility of interstitial tear    Assessment:     Possibility that there may be a tear of this tendon that's not been identified at the current time due to the continued symptoms patient's experiencing    Plan:     Advised on the importance of orthotics importance of ice therapy anti-inflammatories and trying to immobilize as best as possible. We are getting a send the MRI for over reviewed and we'll reevaluate her again in 2 months and may have to consider exploratory surgery of this area if it does not improve

## 2016-03-30 ENCOUNTER — Ambulatory Visit: Payer: BLUE CROSS/BLUE SHIELD | Admitting: Podiatry

## 2016-04-03 ENCOUNTER — Other Ambulatory Visit: Payer: Self-pay | Admitting: Family Medicine

## 2016-04-03 DIAGNOSIS — Z1231 Encounter for screening mammogram for malignant neoplasm of breast: Secondary | ICD-10-CM

## 2016-04-14 ENCOUNTER — Encounter: Payer: Self-pay | Admitting: Podiatry

## 2016-05-01 ENCOUNTER — Ambulatory Visit
Admission: RE | Admit: 2016-05-01 | Discharge: 2016-05-01 | Disposition: A | Discharge status: Home / Self Care | Payer: BLUE CROSS/BLUE SHIELD | Source: Ambulatory Visit | Attending: Family Medicine | Admitting: Family Medicine

## 2016-05-01 DIAGNOSIS — Z1231 Encounter for screening mammogram for malignant neoplasm of breast: Secondary | ICD-10-CM

## 2016-12-11 DIAGNOSIS — Z9884 Bariatric surgery status: Secondary | ICD-10-CM | POA: Insufficient documentation

## 2016-12-11 DIAGNOSIS — E46 Unspecified protein-calorie malnutrition: Secondary | ICD-10-CM | POA: Insufficient documentation

## 2017-03-08 ENCOUNTER — Encounter: Payer: Self-pay | Admitting: Podiatry

## 2017-03-08 ENCOUNTER — Other Ambulatory Visit: Payer: Self-pay | Admitting: Podiatry

## 2017-03-08 ENCOUNTER — Ambulatory Visit (INDEPENDENT_AMBULATORY_CARE_PROVIDER_SITE_OTHER): Payer: BLUE CROSS/BLUE SHIELD

## 2017-03-08 ENCOUNTER — Ambulatory Visit (INDEPENDENT_AMBULATORY_CARE_PROVIDER_SITE_OTHER): Payer: BLUE CROSS/BLUE SHIELD | Admitting: Podiatry

## 2017-03-08 DIAGNOSIS — M25571 Pain in right ankle and joints of right foot: Secondary | ICD-10-CM | POA: Diagnosis not present

## 2017-03-08 DIAGNOSIS — M76829 Posterior tibial tendinitis, unspecified leg: Secondary | ICD-10-CM

## 2017-03-08 DIAGNOSIS — G8929 Other chronic pain: Secondary | ICD-10-CM | POA: Diagnosis not present

## 2017-03-08 DIAGNOSIS — M79671 Pain in right foot: Secondary | ICD-10-CM

## 2017-03-08 MED ORDER — TRIAMCINOLONE ACETONIDE 10 MG/ML IJ SUSP
10.0000 mg | Freq: Once | INTRAMUSCULAR | Status: AC
  Administered 2017-03-08: 10 mg

## 2017-03-08 NOTE — Progress Notes (Signed)
Subjective:    Patient ID: [name], female   DOB: 56 y.o.   MRN: [id number]   HPI patient presents stating I'm having a lot of pain in my right ankle and it's been bothering me for a few months and I'll probably need new orthotics and I'm going to the [place] for a cruise    ROS      Objective:  Physical Exam neurovascular status intact with inflammation and pain of the posterior tibial tendon as it inserts into the navicular right with no indication of tendon dysfunction     Assessment:    Acute posterior tibial tendinitis right     Plan:    Careful sheath injection administered right 3 mg Kenalog 5 g Xylocaine and applied fascial brace to lift the arch and gave instructions on physical therapy and we will discuss Orthotics when she returns from her trip  X-rays indicate there is no signs of significant depression of the arch or navicular pathology

## 2017-03-23 ENCOUNTER — Other Ambulatory Visit: Payer: Self-pay | Admitting: Family Medicine

## 2017-03-23 ENCOUNTER — Ambulatory Visit (INDEPENDENT_AMBULATORY_CARE_PROVIDER_SITE_OTHER): Payer: BLUE CROSS/BLUE SHIELD | Admitting: Podiatry

## 2017-03-23 ENCOUNTER — Encounter: Payer: Self-pay | Admitting: Podiatry

## 2017-03-23 DIAGNOSIS — M76829 Posterior tibial tendinitis, unspecified leg: Secondary | ICD-10-CM

## 2017-03-23 DIAGNOSIS — Z1231 Encounter for screening mammogram for malignant neoplasm of breast: Secondary | ICD-10-CM

## 2017-04-02 ENCOUNTER — Ambulatory Visit (INDEPENDENT_AMBULATORY_CARE_PROVIDER_SITE_OTHER): Payer: BLUE CROSS/BLUE SHIELD | Admitting: Orthotics

## 2017-04-02 DIAGNOSIS — M76829 Posterior tibial tendinitis, unspecified leg: Secondary | ICD-10-CM | POA: Diagnosis not present

## 2017-04-02 DIAGNOSIS — M25571 Pain in right ankle and joints of right foot: Secondary | ICD-10-CM

## 2017-04-02 DIAGNOSIS — M79671 Pain in right foot: Secondary | ICD-10-CM

## 2017-04-02 DIAGNOSIS — G8929 Other chronic pain: Secondary | ICD-10-CM

## 2017-04-02 NOTE — Progress Notes (Signed)
Patient presents today with a hx of PTTD/AAF.  Upon assessment, patient has pronounced pes planus w/ a valgus RF deformity.  Patient has medially shifted talus/navicular.  Goal is provide longitudinal arch support and RF stability.  Plan on deep heel cup, hug arch, wide foot orthosis w/ medial flange and varus correction for RF valgus deformity.  Patient educated in the progessive nature of PTTD and financial responsibility. Patient presents today with a hx of PTTD/AAF.  Upon assessment, patient has pronounced pes planus w/ a valgus RF deformity.  Patient has medially shifted talus/navicular.  Goal is provide longitudinal arch support and RF stability.  Plan on deep heel cup, hug arch, wide foot orthosis w/ medial flange and varus correction for RF valgus deformity.  Patient educated in the progessive nature of PTTD and financial responsibility.

## 2017-04-18 NOTE — Progress Notes (Signed)
HPI patient presents stating I'm having a lot of pain in my right ankle and it's been bothering me for a few months. She states that she noticed an improvement in her pain from last visit  Acute posterior tibial tendinitis   Advised on usage of supportive shoes, appointment is to be made with R. Puckett for orthotics. Re-appointed as needed

## 2017-04-23 ENCOUNTER — Encounter: Payer: BLUE CROSS/BLUE SHIELD | Admitting: Orthotics

## 2017-05-02 ENCOUNTER — Ambulatory Visit
Admission: RE | Admit: 2017-05-02 | Discharge: 2017-05-02 | Disposition: A | Discharge status: Home / Self Care | Payer: BLUE CROSS/BLUE SHIELD | Source: Ambulatory Visit | Attending: Family Medicine | Admitting: Family Medicine

## 2017-05-02 DIAGNOSIS — Z1231 Encounter for screening mammogram for malignant neoplasm of breast: Secondary | ICD-10-CM

## 2017-05-10 ENCOUNTER — Ambulatory Visit: Payer: BLUE CROSS/BLUE SHIELD | Admitting: Orthotics

## 2017-05-10 DIAGNOSIS — M76829 Posterior tibial tendinitis, unspecified leg: Secondary | ICD-10-CM

## 2017-05-10 DIAGNOSIS — M25571 Pain in right ankle and joints of right foot: Secondary | ICD-10-CM

## 2017-05-10 DIAGNOSIS — G8929 Other chronic pain: Secondary | ICD-10-CM

## 2017-05-10 NOTE — Progress Notes (Signed)
Patient came in today to pick up custom made foot orthotics.  The goals were accomplished and the patient reported no dissatisfaction with said orthotics.  Patient was advised of breakin period and how to report any issues. 

## 2017-05-21 ENCOUNTER — Other Ambulatory Visit: Payer: Self-pay | Admitting: Family Medicine

## 2017-05-21 ENCOUNTER — Ambulatory Visit
Admission: RE | Admit: 2017-05-21 | Discharge: 2017-05-21 | Disposition: A | Discharge status: Home / Self Care | Payer: BLUE CROSS/BLUE SHIELD | Source: Ambulatory Visit | Attending: Family Medicine | Admitting: Family Medicine

## 2017-05-21 DIAGNOSIS — R109 Unspecified abdominal pain: Secondary | ICD-10-CM

## 2017-05-23 ENCOUNTER — Other Ambulatory Visit: Payer: Self-pay | Admitting: Family Medicine

## 2017-05-23 DIAGNOSIS — R109 Unspecified abdominal pain: Secondary | ICD-10-CM

## 2017-05-31 ENCOUNTER — Ambulatory Visit
Admission: RE | Admit: 2017-05-31 | Discharge: 2017-05-31 | Disposition: A | Discharge status: Home / Self Care | Payer: BLUE CROSS/BLUE SHIELD | Source: Ambulatory Visit | Attending: Family Medicine | Admitting: Family Medicine

## 2017-05-31 DIAGNOSIS — R109 Unspecified abdominal pain: Secondary | ICD-10-CM

## 2017-10-05 ENCOUNTER — Other Ambulatory Visit: Payer: Self-pay | Admitting: Urology

## 2017-10-05 DIAGNOSIS — D3000 Benign neoplasm of unspecified kidney: Secondary | ICD-10-CM

## 2017-11-14 ENCOUNTER — Ambulatory Visit (HOSPITAL_COMMUNITY)
Admission: RE | Admit: 2017-11-14 | Discharge: 2017-11-14 | Disposition: A | Discharge status: Home / Self Care | Payer: 59 | Source: Ambulatory Visit | Attending: Urology | Admitting: Urology

## 2017-11-14 DIAGNOSIS — N281 Cyst of kidney, acquired: Secondary | ICD-10-CM | POA: Insufficient documentation

## 2017-11-14 DIAGNOSIS — D3000 Benign neoplasm of unspecified kidney: Secondary | ICD-10-CM | POA: Insufficient documentation

## 2018-03-06 ENCOUNTER — Ambulatory Visit (INDEPENDENT_AMBULATORY_CARE_PROVIDER_SITE_OTHER): Payer: 59

## 2018-03-06 ENCOUNTER — Ambulatory Visit: Payer: 59 | Admitting: Podiatry

## 2018-03-06 ENCOUNTER — Encounter: Payer: Self-pay | Admitting: Podiatry

## 2018-03-06 DIAGNOSIS — M76829 Posterior tibial tendinitis, unspecified leg: Secondary | ICD-10-CM

## 2018-03-06 DIAGNOSIS — M722 Plantar fascial fibromatosis: Secondary | ICD-10-CM | POA: Diagnosis not present

## 2018-03-06 DIAGNOSIS — M21622 Bunionette of left foot: Secondary | ICD-10-CM | POA: Diagnosis not present

## 2018-03-06 MED ORDER — TRIAMCINOLONE ACETONIDE 10 MG/ML IJ SUSP
10.0000 mg | Freq: Once | INTRAMUSCULAR | Status: AC
  Administered 2018-03-06: 10 mg

## 2018-03-06 NOTE — Patient Instructions (Signed)

## 2018-03-06 NOTE — Progress Notes (Signed)
Subjective:   Patient ID: [name], female   DOB: 57 y.o.   MRN: [id number]   HPI Patient states she is getting a lot of pain in the mid arch area bilaterally again and stated she did have relief in the orthotics seem to be helping.  Also has pain in the outside of the fifth metatarsal left   ROS      Objective:  Physical Exam  Neurovascular status intact with inflammation fluid buildup of the mid arch area bilateral with discomfort also around the fifth metatarsal head left     Assessment:  Inflammatory mid arch fasciitis bilateral along with tailor's bunion deformity with inflammation left     Plan:  H&P discussed both conditions and today I injected the plantar fascial bilateral mid arch 3 mg Kenalog 5 Milgram Xylocaine discussed tailor's bunion put a small amount of medicines and advised on wider shoes.  Discussed point in the future surgical intervention may be necessary

## 2018-04-10 ENCOUNTER — Other Ambulatory Visit: Payer: Self-pay | Admitting: Family Medicine

## 2018-04-10 DIAGNOSIS — Z1231 Encounter for screening mammogram for malignant neoplasm of breast: Secondary | ICD-10-CM

## 2018-04-11 ENCOUNTER — Ambulatory Visit: Payer: 59 | Admitting: Podiatry

## 2018-05-13 ENCOUNTER — Ambulatory Visit
Admission: RE | Admit: 2018-05-13 | Discharge: 2018-05-13 | Disposition: A | Discharge status: Home / Self Care | Payer: 59 | Source: Ambulatory Visit | Attending: Family Medicine | Admitting: Family Medicine

## 2018-05-13 DIAGNOSIS — Z1231 Encounter for screening mammogram for malignant neoplasm of breast: Secondary | ICD-10-CM

## 2019-05-19 ENCOUNTER — Other Ambulatory Visit: Payer: Self-pay | Admitting: Family Medicine

## 2019-09-11 ENCOUNTER — Ambulatory Visit: Payer: 59

## 2019-09-11 ENCOUNTER — Other Ambulatory Visit: Payer: Self-pay | Admitting: Family Medicine

## 2019-09-11 DIAGNOSIS — Z1231 Encounter for screening mammogram for malignant neoplasm of breast: Secondary | ICD-10-CM

## 2019-11-14 ENCOUNTER — Ambulatory Visit
Admission: RE | Admit: 2019-11-14 | Discharge: 2019-11-14 | Disposition: A | Discharge status: Home / Self Care | Payer: 59 | Source: Ambulatory Visit | Attending: Family Medicine | Admitting: Family Medicine

## 2019-11-14 ENCOUNTER — Other Ambulatory Visit: Payer: Self-pay

## 2019-11-14 ENCOUNTER — Ambulatory Visit: Payer: Self-pay

## 2019-11-14 DIAGNOSIS — Z1231 Encounter for screening mammogram for malignant neoplasm of breast: Secondary | ICD-10-CM

## 2020-05-10 ENCOUNTER — Other Ambulatory Visit: Payer: Self-pay | Admitting: Family Medicine

## 2020-05-10 DIAGNOSIS — M545 Low back pain, unspecified: Secondary | ICD-10-CM

## 2020-05-19 ENCOUNTER — Ambulatory Visit
Admission: RE | Admit: 2020-05-19 | Discharge: 2020-05-19 | Disposition: A | Discharge status: Home / Self Care | Payer: 59 | Source: Ambulatory Visit | Attending: Family Medicine | Admitting: Family Medicine

## 2020-05-19 ENCOUNTER — Other Ambulatory Visit: Payer: Self-pay

## 2020-05-19 DIAGNOSIS — M545 Low back pain, unspecified: Secondary | ICD-10-CM

## 2020-05-30 ENCOUNTER — Other Ambulatory Visit: Payer: 59

## 2020-06-23 ENCOUNTER — Encounter: Payer: Self-pay | Admitting: Podiatry

## 2020-06-23 ENCOUNTER — Ambulatory Visit (INDEPENDENT_AMBULATORY_CARE_PROVIDER_SITE_OTHER): Payer: 59

## 2020-06-23 ENCOUNTER — Ambulatory Visit (INDEPENDENT_AMBULATORY_CARE_PROVIDER_SITE_OTHER): Payer: 59 | Admitting: Podiatry

## 2020-06-23 ENCOUNTER — Other Ambulatory Visit: Payer: Self-pay

## 2020-06-23 DIAGNOSIS — M21619 Bunion of unspecified foot: Secondary | ICD-10-CM

## 2020-06-23 DIAGNOSIS — M722 Plantar fascial fibromatosis: Secondary | ICD-10-CM | POA: Diagnosis not present

## 2020-06-23 MED ORDER — DICLOFENAC SODIUM 75 MG PO TBEC: 75.0000 mg | DELAYED_RELEASE_TABLET | Freq: Two times a day (BID) | ORAL | 2 refills | Status: DC

## 2020-06-23 NOTE — Progress Notes (Signed)
Subjective:   Patient ID: [name], female   DOB: 59 y.o.   MRN: [id number]   HPI Patient states that her mid arch hurts left and her plantar heel hurts right.  States both of them bother her and it hard for her to walk and its been really present for the last 2 months   ROS      Objective:  Physical Exam  Neurovascular status intact with patient found to have exquisite discomfort mid arch left plantar heel right with orthotics that she currently lost secondary to throwing them away     Assessment:  Acute mid arch fasciitis left plantar arch pain right with patient having difficulty with stretching     Plan:  H&P x-rays reviewed today on the left I did a mid arch injection 3 mg Kenalog 5 mg Xylocaine and for the right I injected the fascia at the insertion 3 mg Kenalog 5 mg Xylocaine and I dispensed night splint with all instructions on usage.  We may need to do a new orthotic to see back next week  X-rays indicate no signs of fracture no signs of bony pathology associated with this

## 2020-06-23 NOTE — Patient Instructions (Signed)

## 2020-07-01 ENCOUNTER — Other Ambulatory Visit: Payer: Self-pay

## 2020-07-01 ENCOUNTER — Encounter: Payer: Self-pay | Admitting: Podiatry

## 2020-07-01 ENCOUNTER — Ambulatory Visit (INDEPENDENT_AMBULATORY_CARE_PROVIDER_SITE_OTHER): Payer: 59 | Admitting: Podiatry

## 2020-07-01 DIAGNOSIS — M722 Plantar fascial fibromatosis: Secondary | ICD-10-CM

## 2020-07-06 NOTE — Progress Notes (Signed)
Subjective:   Patient ID: [name], female   DOB: 60 y.o.   MRN: [id number]   HPI Patient states she is having a lot of pain in the bottom of her left heel and it is getting worse and making walking difficult   ROS      Objective:  Physical Exam  Neurovascular status intact negative Denna Haggard' sign noted with patient's left plantar heel pain being quite sore with the right doing much better     Assessment:  Acute plantar fasciitis left not responding conservatively     Plan:  H&P reviewed condition and I went ahead I applied immobilization CAM Walker along with ice therapy anti-inflammatories reduced activity and reappoint to recheck 3 weeks

## 2020-07-28 ENCOUNTER — Ambulatory Visit: Payer: 59 | Admitting: Podiatry

## 2020-08-02 ENCOUNTER — Other Ambulatory Visit: Payer: Self-pay

## 2020-08-02 ENCOUNTER — Ambulatory Visit (INDEPENDENT_AMBULATORY_CARE_PROVIDER_SITE_OTHER): Payer: 59 | Admitting: Podiatry

## 2020-08-02 DIAGNOSIS — M722 Plantar fascial fibromatosis: Secondary | ICD-10-CM

## 2020-08-02 NOTE — Progress Notes (Signed)
Subjective:   Patient ID: [name], female   DOB: 60 y.o.   MRN: [id number]   HPI Patient states her left heel and arch continue to kill her and it did not respond well to the boot.  States that while in the boot is somewhat better but the minute she takes it off it starts to hurt again and she cannot live this way   ROS      Objective:  Physical Exam  Neurovascular status intact with patient found to have exquisite mid arch discomfort left arch with mild discomfort into the heel.  Very sore when pressed makes walking difficult and has become gradually worse over the last several months but patient states this is been present for several years     Assessment:  Chronic fasciitis of the left arch that is not responded to numerous conservative treatments including injection immobilization oral anti-inflammatories physical therapy shoe gear modifications     Plan:  H&P reviewed condition at great length.  I do think best long-term solution will be surgical intervention and I reviewed that with her recommending release of the medial band of the fascia in a more distal fashion and insertion.  I did allow her to read consent form and she understands all alternative treatments complications and that there is absolutely no long-term guarantees that this will solve her problem.  Also understands total recovery can take approximately 6 months and she will be in a boot for at least 4 weeks.  Patient wants surgery and after extensive review signed consent form questions answered and she is scheduled for outpatient surgery encouraged to call with questions concerns

## 2020-08-04 ENCOUNTER — Telehealth: Payer: Self-pay | Admitting: Podiatry

## 2020-08-04 NOTE — Telephone Encounter (Signed)
DOS: 08/10/2020  Procedure: Endoscopic Plantar Fasciotomy Mid Arch Lt [phone])  [facility] Effective From 07/04/2019 - 08/30/2020  Deductible: $0 Out of Pocket: $1,500 with $0 met and $1,500 remaining CoInsurance: 20% Copay: $

## 2020-08-06 ENCOUNTER — Telehealth: Payer: Self-pay

## 2020-08-06 NOTE — Telephone Encounter (Signed)
DOS 08/10/2020  EPF LT - [phone]  RECEIVED FAX FROM [facility] - NO PRECERT REQUIRED FOR CPT [phone]. REF # [id number]

## 2020-08-09 MED ORDER — HYDROCODONE-ACETAMINOPHEN 10-325 MG PO TABS: 1.0000 | ORAL_TABLET | Freq: Three times a day (TID) | ORAL | 0 refills | Status: AC | PRN

## 2020-08-09 MED ORDER — ONDANSETRON HCL 4 MG PO TABS: 4.0000 mg | ORAL_TABLET | Freq: Three times a day (TID) | ORAL | 0 refills | Status: DC | PRN

## 2020-08-09 NOTE — Addendum Note (Signed)
Addended by: Wallene Huh on: 08/09/2020 03:23 PM   Modules accepted: Orders

## 2020-08-10 ENCOUNTER — Encounter: Payer: Self-pay | Admitting: Podiatry

## 2020-08-10 DIAGNOSIS — M722 Plantar fascial fibromatosis: Secondary | ICD-10-CM | POA: Diagnosis not present

## 2020-08-11 ENCOUNTER — Encounter: Payer: 59 | Admitting: Podiatry

## 2020-08-16 ENCOUNTER — Other Ambulatory Visit: Payer: Self-pay

## 2020-08-16 ENCOUNTER — Ambulatory Visit (INDEPENDENT_AMBULATORY_CARE_PROVIDER_SITE_OTHER): Payer: 59 | Admitting: Podiatry

## 2020-08-16 DIAGNOSIS — M722 Plantar fascial fibromatosis: Secondary | ICD-10-CM

## 2020-08-17 NOTE — Progress Notes (Signed)
Subjective:   Patient ID: [name], female   DOB: 60 y.o.   MRN: [id number]   HPI Patient states so far doing very well very happy with how she is doing   ROS      Objective:  Physical Exam  Neurovascular status intact negative Bevelyn Buckles' sign noted left arch healing well wound edges well coapted significant reduction of pressure on the arch     Assessment:  Doing well post mid arch fasciotomy left     Plan:  H&P reviewed condition continue with immobilization elevation compression sterile dressing reapplied reappoint 2 weeks suture removal earlier if needed

## 2020-08-18 ENCOUNTER — Telehealth: Payer: Self-pay | Admitting: Podiatry

## 2020-08-18 NOTE — Telephone Encounter (Signed)
pts left surgical foot is hurting and the shoe is rubbing on the sides and the bottom has no support for her plantar fasciitis. Is there something we can do to help with the bottom of her foot to give support and on the sides from rubbing both are causing a lot of pain.

## 2020-08-19 NOTE — Telephone Encounter (Signed)
Not really at this point. Will need to try to wear some type of immobilization. Can begin to soak the foot which may help with discomfort

## 2020-08-30 ENCOUNTER — Other Ambulatory Visit: Payer: Self-pay

## 2020-08-30 ENCOUNTER — Ambulatory Visit (INDEPENDENT_AMBULATORY_CARE_PROVIDER_SITE_OTHER): Payer: 59

## 2020-08-30 ENCOUNTER — Encounter: Payer: 59 | Admitting: Podiatry

## 2020-08-30 DIAGNOSIS — Z9889 Other specified postprocedural states: Secondary | ICD-10-CM

## 2020-08-30 NOTE — Progress Notes (Signed)
Patient presents today for POV #2 after EPF. Patient denies nausea, vomiting, fever and chills. Pain reported 6 out of 10 today. Taking tylenol for pain control at this time. Sutures removed. Encouraged patient to gradually switch to supportive shoes and follow-up as needed. Patient verbalized understanding.

## 2020-10-14 ENCOUNTER — Other Ambulatory Visit: Payer: Self-pay | Admitting: Family Medicine

## 2020-10-14 DIAGNOSIS — Z1231 Encounter for screening mammogram for malignant neoplasm of breast: Secondary | ICD-10-CM

## 2020-12-03 ENCOUNTER — Ambulatory Visit
Admission: RE | Admit: 2020-12-03 | Discharge: 2020-12-03 | Disposition: A | Discharge status: Home / Self Care | Payer: 59 | Source: Ambulatory Visit | Attending: Family Medicine | Admitting: Family Medicine

## 2020-12-03 ENCOUNTER — Other Ambulatory Visit: Payer: Self-pay

## 2020-12-03 DIAGNOSIS — Z1231 Encounter for screening mammogram for malignant neoplasm of breast: Secondary | ICD-10-CM

## 2021-03-29 ENCOUNTER — Other Ambulatory Visit (HOSPITAL_COMMUNITY): Payer: Self-pay | Admitting: Orthopedic Surgery

## 2021-03-29 ENCOUNTER — Other Ambulatory Visit: Payer: Self-pay | Admitting: Orthopedic Surgery

## 2021-03-29 DIAGNOSIS — M25562 Pain in left knee: Secondary | ICD-10-CM

## 2021-04-07 ENCOUNTER — Encounter (HOSPITAL_COMMUNITY): Payer: 59

## 2021-04-15 ENCOUNTER — Encounter (HOSPITAL_COMMUNITY)
Admission: RE | Admit: 2021-04-15 | Discharge: 2021-04-15 | Disposition: A | Discharge status: Home / Self Care | Payer: 59 | Source: Ambulatory Visit | Attending: Orthopedic Surgery | Admitting: Orthopedic Surgery

## 2021-04-15 ENCOUNTER — Other Ambulatory Visit: Payer: Self-pay

## 2021-04-15 DIAGNOSIS — M25562 Pain in left knee: Secondary | ICD-10-CM | POA: Diagnosis not present

## 2021-04-15 MED ORDER — TECHNETIUM TC 99M MEDRONATE IV KIT
20.0000 | PACK | Freq: Once | INTRAVENOUS | Status: AC | PRN
  Administered 2021-04-15: 20 via INTRAVENOUS

## 2021-04-18 ENCOUNTER — Encounter: Payer: Self-pay | Admitting: *Deleted

## 2021-05-02 ENCOUNTER — Encounter: Payer: Self-pay | Admitting: Cardiovascular Disease

## 2021-05-02 NOTE — Progress Notes (Signed)
This encounter was created in error - please disregard.

## 2021-05-03 ENCOUNTER — Encounter: Payer: 59 | Admitting: Cardiovascular Disease

## 2021-05-12 ENCOUNTER — Other Ambulatory Visit: Payer: Self-pay

## 2021-05-12 ENCOUNTER — Ambulatory Visit (INDEPENDENT_AMBULATORY_CARE_PROVIDER_SITE_OTHER): Payer: 59

## 2021-05-12 ENCOUNTER — Encounter: Payer: Self-pay | Admitting: Internal Medicine

## 2021-05-12 ENCOUNTER — Encounter: Payer: Self-pay | Admitting: Podiatry

## 2021-05-12 ENCOUNTER — Ambulatory Visit (INDEPENDENT_AMBULATORY_CARE_PROVIDER_SITE_OTHER): Payer: 59 | Admitting: Podiatry

## 2021-05-12 ENCOUNTER — Ambulatory Visit (INDEPENDENT_AMBULATORY_CARE_PROVIDER_SITE_OTHER): Payer: 59 | Admitting: Internal Medicine

## 2021-05-12 VITALS — BP 122/84 | Ht 68.5 in | Wt 253.2 lb

## 2021-05-12 DIAGNOSIS — Z79899 Other long term (current) drug therapy: Secondary | ICD-10-CM | POA: Diagnosis not present

## 2021-05-12 DIAGNOSIS — M722 Plantar fascial fibromatosis: Secondary | ICD-10-CM

## 2021-05-12 DIAGNOSIS — Z9889 Other specified postprocedural states: Secondary | ICD-10-CM | POA: Diagnosis not present

## 2021-05-12 DIAGNOSIS — G4733 Obstructive sleep apnea (adult) (pediatric): Secondary | ICD-10-CM

## 2021-05-12 MED ORDER — TRIAMCINOLONE ACETONIDE 10 MG/ML IJ SUSP
10.0000 mg | Freq: Once | INTRAMUSCULAR | Status: AC
  Administered 2021-05-12: 10 mg

## 2021-05-12 NOTE — Progress Notes (Signed)
Subjective:   Patient ID: [name], female   DOB: 60 y.o.   MRN: [id number]   HPI Patient states she has started to develop pain more distal in the fascia left but states that it is sore and that it seemed to do well for a number once after surgery but it seems to be in a different area but present   ROS      Objective:  Physical Exam  Neurovascular status intact original surgical site has healed well there is what appears to be good release of the medial fascial band but it appears to be tight within the central band and distal to where we released the fascia     Assessment:  Possibility for a second area of inflamed fascial left     Plan:  Went ahead today did sterile prep and injected distal 3 mg Kenalog 5 mg Xylocaine into the central band of the fascia we will see results and I did explain to her it is possible in the end we will need to open this up from up manner approach and resect the fascia  X-rays are negative for loss of arch height or any indications of calcification issue

## 2021-05-12 NOTE — Patient Instructions (Signed)
Medication Instructions:  Your physician recommends that you continue on your current medications as directed. Please refer to the Current Medication list given to you today.  *If you need a refill on your cardiac medications before your next appointment, please call your pharmacy*   Lab Work: VIT D level  If you have labs (blood work) drawn today and your tests are completely normal, you will receive your results only by: [name] (if you have MyChart) OR A paper copy in the mail If you have any lab test that is abnormal or we need to change your treatment, we will call you to review the results.   Testing/Procedures: [facility] Home Sleep Study   Follow-Up: At [facility], you and your health needs are our priority.  As part of our continuing mission to provide you with exceptional heart care, we have created designated Provider Care Teams.  These Care Teams include your primary Cardiologist (physician) and Advanced Practice Providers (APPs -  Physician Assistants and Nurse Practitioners) who all work together to provide you with the care you need, when you need it.  We recommend signing up for the patient portal called "MyChart".  Sign up information is provided on this After Visit Summary.  MyChart is used to connect with patients for Virtual Visits (Telemedicine).  Patients are able to view lab/test results, encounter notes, upcoming appointments, etc.  Non-urgent messages can be sent to your provider as well.   To learn more about what you can do with MyChart, go to [email or web address].    Your next appointment:  As needed

## 2021-05-12 NOTE — Progress Notes (Addendum)
Cardiology Office Note   Date:  05/12/2021   ID:  [name], DOB [birth year], MRN [id number]  PCP:  Donald Prose, MD  Cardiologist:   Dorris Carnes, MD   Patient referred for eval of LE edema     History of Present Illness: [name] is a 60 y.o. female with a history of Hypertension, sleep apnea, morbid obesity.  She was referred by Dr. Nancy Fetter for evaluation of lower extremity edema.  The patient says her ankles have always been large.  She says she follows after her mother who had similar appearing ankles .  No change over time.  She also has varicose veins.  The patient is fairly active.  She denies chest pain.  No significant shortness of breath, no change in her ability to do things.  Denies palpitations.  No dizziness.  She says she is not at her optimal weight she never has been.  Goal of 185 pounds.  She is s/p gastric sleeve procedure in past    Br:  Protein drink.  [employer] protein Lunch:  Salad   Some junk foood Dinner:  Same  Also veggie/meat Snacks   often Water Admits to eating a lot of candy   Current Meds  Medication Sig   furosemide (LASIX) 20 MG tablet Take 20 mg by mouth daily as needed.   levocetirizine (XYZAL) 5 MG tablet Take 5 mg by mouth daily.   meclizine (ANTIVERT) 12.5 MG tablet Take 12.5-25 mg by mouth 2 (two) times daily as needed.   Multiple Vitamins-Minerals (MULTIVITAMIN PO) Take 1 tablet by mouth daily.   omeprazole (PRILOSEC) 40 MG capsule Take 40 mg by mouth 2 (two) times daily.   pantoprazole (PROTONIX) 40 MG tablet Take 40 mg by mouth 2 (two) times daily.   senna-docusate (SENOKOT-S) 8.6-50 MG tablet Take 1 tablet by mouth 2 (two) times daily.   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 Her smoking use included cigarettes. She has never used smokeless tobacco. She reports current alcohol use. She reports that she does not use drugs.  Family History:  The patient's family history includes Cervical cancer in her mother; Colon polyps in her father; Diabetes in her father and paternal grandmother; Hypertension in her father, mother, and sister; Ovarian cancer in her maternal grandmother and sister; Throat cancer in her father.    ROS:  Please see the history of present illness. All other systems are reviewed and  Negative to the above problem except as noted.    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  Respiratory:  clear to auscultation bilaterally,  GI: soft, nontender, nondistended, + BS  No hepatomegaly  MS: no deformity Moving all extremities   Skin: warm and  dry, no rash.  Varicosities present. Neuro:  Strength and sensation are intact Psych: euthymic mood, full affect   EKG:  EKG is ordered today.  Normal sinus rhythm.  NSR 71 bpm     Lipid Panel No results found for: CHOL, TRIG, HDL, CHOLHDL, VLDL, LDLCALC, LDLDIRECT    Wt Readings from Last 3 Encounters:  05/12/21 253 lb 3.2 oz (114.9 kg)  08/08/13 253 lb (114.8 kg)  04/25/13 250 lb (113.4 kg)      ASSESSMENT AND PLAN:  1.  Edema.  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  Follows after her mother.  She does have varicosities with trivial pitting.  Spoke to her about support socks.  Otherwise cardiac exam is normal I do not think this is due to heart failure.  2 hypertension blood pressure is good today she is not she is on Lasix as needed.  3.  History of sleep apnea we will set up for a new sleep study.  4  lipids good.  5  Morbid obesity discussed diet.  Would recommend a keto type diet.  Gave her the name of fat fiction video.   Says she will review.  Will be available as needed in future    Current medicines are reviewed at length with the patient today.  The patient does not have concerns regarding medicines.  Signed, Dorris Carnes, MD  05/12/2021 10:21 AM    [address], [place], [place]  [address] Phone: [phone]; Fax: [phone]

## 2021-05-13 LAB — VITAMIN D 25 HYDROXY (VIT D DEFICIENCY, FRACTURES): Vit D, 25-Hydroxy: 27.2 ng/mL — ABNORMAL LOW (ref 30.0–100.0)

## 2021-05-17 ENCOUNTER — Telehealth: Payer: Self-pay | Admitting: *Deleted

## 2021-05-17 NOTE — Telephone Encounter (Signed)
Placed call to pt and she has been made aware that "no authorization required" for the Itamar Sleep Study, per her insurance.  She was provided with the code, 1234, to perform the sleep study.

## 2021-05-17 NOTE — Telephone Encounter (Signed)
-----   Message from Lauralee Evener, [place] sent at 05/17/2021  2:13 PM EST ----- Regarding: RE: Jaynie [facility] study Per patient's insurance no PA is required. Ok to activate PIN. ----- Message ----- From: Michae Kava, CMA Sent: 05/12/2021  11:38 AM EST To: [facility] Sleep Studies Subject: Itamar study                                   Hi ladies,   Dr. Harrington Challenger ordered an Itamar study. Can someone please check for authorization. I appreciate the help in this matter.   Thank you  Arbie Cookey

## 2021-05-18 ENCOUNTER — Telehealth: Payer: Self-pay

## 2021-05-18 ENCOUNTER — Encounter: Payer: Self-pay | Admitting: *Deleted

## 2021-05-18 DIAGNOSIS — E559 Vitamin D deficiency, unspecified: Secondary | ICD-10-CM

## 2021-05-18 DIAGNOSIS — Z79899 Other long term (current) drug therapy: Secondary | ICD-10-CM

## 2021-05-18 MED ORDER — VITAMIN D 125 MCG (5000 UT) PO CAPS: 5000.0000 [IU]/d | ORAL_CAPSULE | Freq: Every day | ORAL | 1 refills | Status: AC

## 2021-05-18 NOTE — Telephone Encounter (Signed)
Pt verbalized understanding of her lab results.

## 2021-05-18 NOTE — Telephone Encounter (Signed)
This encounter was created in error - please disregard.

## 2021-05-18 NOTE — Telephone Encounter (Signed)
-----   Message from [place], MD sent at 05/15/2021  8:54 PM EST ----- Vit D is low    I would recomm 5000 U/ day   Goal:   Over 50 Check in 3 months   Can by done by Dr Nancy Fetter

## 2021-06-18 ENCOUNTER — Encounter (INDEPENDENT_AMBULATORY_CARE_PROVIDER_SITE_OTHER): Payer: 59 | Admitting: Cardiology

## 2021-06-18 DIAGNOSIS — G4733 Obstructive sleep apnea (adult) (pediatric): Secondary | ICD-10-CM

## 2021-06-18 NOTE — Procedures (Signed)
° °  Sleep Study Report  Patient Information Study Date: 06/18/21 Patient Name: [name] Patient ID: [id number] Birth Date: 11-Jun-2061 Age: 60 Gender: Female BMI: 37.6 (W=253 lb, H=5' 9'') Referring Physician:Paula Harrington Challenger, MD  TEST DESCRIPTION: Home sleep apnea testing was completed using the WatchPat, a Type 1 device, utilizing peripheral arterial tonometry (PAT), chest movement, actigraphy, pulse oximetry, pulse rate, body position and snore. AHI was calculated with apnea and hypopnea using valid sleep time as the denominator. RDI includes apneas, hypopneas, and RERAs. The data acquired and the scoring of sleep and all associated events were performed in accordance with the recommended standards and specifications as outlined in the AASM Manual for the Scoring of Sleep and Associated Events 2.2.0 (2015).  FINDINGS: 1. Mild Obstructive Sleep Apnea with AHI 12.2/hr. 2. No Central Sleep Apnea with pAHIc 0/hr. 3. Oxygen desaturations as low as 87%. 4. Mild snoring was present. O2 sats were < 88% for 0 min. 5. Total sleep time was 2 hrs and 40min. 6. 28% of total sleep time was spent in REM sleep. 7. Prolonged sleep onset latency at 28 min. 8. Shortened REM sleep onset latency at 57 min. 9. Total awakenings were 8.  DIAGNOSIS: Mild Obstructive Sleep Apnea (G47.33)  RECOMMENDATIONS: 1. Clinical correlation of these findings is necessary. The decision to treat obstructive sleep apnea (OSA) is usually based on the presence of apnea symptoms or the presence of associated medical conditions such as Hypertension, Congestive Heart Failure, Atrial Fibrillation or Obesity. The most common symptoms of OSA are snoring, gasping for breath while sleeping, daytime sleepiness and fatigue.  2. Initiating apnea therapy is recommended given the presence of symptoms and/or associated conditions. Recommend proceeding with one of the following:   a. Auto-CPAP therapy with a pressure range of  5-20cm H2O.   b. An oral appliance (OA) that can be obtained from certain dentists with expertise in sleep medicine. These are primarily of use in non-obese patients with mild and moderate disease.   c. An ENT consultation which may be useful to look for specific causes of obstruction and possible treatment options.   d. If patient is intolerant to PAP therapy, consider referral to ENT for evaluation for hypoglossal nerve stimulator. 3. Close follow-up is necessary to ensure success with CPAP or oral appliance therapy for maximum benefit .  4. A follow-up oximetry study on CPAP is recommended to assess the adequacy of therapy and determine the need for supplemental oxygen or the potential need for Bi-level therapy. An arterial blood gas to determine the adequacy of baseline ventilation and oxygenation should also be considered.  5. Healthy sleep recommendations include: adequate nightly sleep (normal 7-9 hrs/night), avoidance of caffeine after noon and alcohol near bedtime, and maintaining a sleep environment that is cool, dark and quiet.  6. Weight loss for overweight patients is recommended. Even modest amounts of weight loss can significantly improve the severity of sleep apnea.  7. Snoring recommendations include: weight loss where appropriate, side sleeping, and avoidance of alcohol before bed.  8. Operation of motor vehicle should be avoided when sleepy.  Signature: Electronically Signed: 06/18/21 Fransico Him, MD; [facility] [address]; [place], American Board of Sleep Medicine

## 2021-06-22 ENCOUNTER — Telehealth: Payer: Self-pay | Admitting: *Deleted

## 2021-06-22 DIAGNOSIS — G4733 Obstructive sleep apnea (adult) (pediatric): Secondary | ICD-10-CM

## 2021-06-22 NOTE — Telephone Encounter (Addendum)
The patient has been notified of the result and verbalized understanding.  All questions (if any) were answered. [name], [place] 06/22/2021 6:05 PM

## 2021-06-22 NOTE — Telephone Encounter (Signed)
-----   Message from Sueanne Margarita, MD sent at 06/18/2021 11:28 AM EST ----- Please let patient know that they have sleep apnea and recommend treating with CPAP.  Please order an auto CPAP from 4-15cm H2O with heated humidity and mask of choice.  Order overnight pulse ox on CPAP.  Followup with me in 6 weeks.

## 2021-07-05 ENCOUNTER — Other Ambulatory Visit: Payer: Self-pay | Admitting: Orthopedic Surgery

## 2021-07-05 ENCOUNTER — Ambulatory Visit: Payer: Managed Care, Other (non HMO)

## 2021-07-05 DIAGNOSIS — G4733 Obstructive sleep apnea (adult) (pediatric): Secondary | ICD-10-CM

## 2021-07-05 DIAGNOSIS — Z79899 Other long term (current) drug therapy: Secondary | ICD-10-CM

## 2021-07-12 ENCOUNTER — Encounter (HOSPITAL_COMMUNITY): Payer: Self-pay

## 2021-07-12 NOTE — Patient Instructions (Addendum)
DUE TO COVID-19 ONLY ONE VISITOR IS ALLOWED TO COME WITH YOU AND STAY IN THE WAITING ROOM ONLY DURING PRE OP AND PROCEDURE.   **NO VISITORS ARE ALLOWED IN THE SHORT STAY AREA OR RECOVERY ROOM!!**        Your procedure is scheduled on: Monday, Jan. 23, 2023   Report to [facility] Main Entrance    Report to admitting at 8:00 AM   Call this number if you have problems the morning of surgery [phone]   Do not eat food :After Midnight.   May have liquids until   7:45 AM day of surgery  CLEAR LIQUID DIET  Foods Allowed                                                                     Foods Excluded  Water, Black Coffee and tea, regular and decaf                             liquids that you cannot  Plain Jell-O in any flavor  (No red)                                           see through such as: Fruit ices (not with fruit pulp)                                     milk, soups, orange juice              Iced Popsicles (No red)                                    All solid food                                   Apple juices Sports drinks like Gatorade (No red) Lightly seasoned clear broth or consume(fat free) Sugar           The day of surgery:  Drink ONE (1) Pre-Surgery Clear Ensure at 7:45 AM the morning of surgery. Drink in one sitting. Do not sip.  This drink was given to you during your hospital  pre-op appointment visit. Nothing else to drink after completing the  Pre-Surgery Clear Ensure.          If you have questions, please contact your surgeons office.     Oral Hygiene is also important to reduce your risk of infection.                                    Remember - BRUSH YOUR TEETH THE MORNING OF SURGERY WITH YOUR REGULAR  TOOTHPASTE  Bring CPAP mask and tubing day of surgery   Do NOT smoke after Midnight   Take these medicines the morning of surgery with A  SIP OF WATER: Omeprazole                              You may not have any metal on your  body including hair pins, jewelry, and body piercing             Do not wear make-up, lotions, powders, perfumes/cologne, or deodorant  Do not wear nail polish including gel and S&S, artificial/acrylic nails, or any other type of covering on natural nails including finger and toenails. If you have artificial nails, gel coating, etc. that needs to be removed by a nail salon please have this removed prior to surgery or surgery may need to be canceled/ delayed if the surgeon/ anesthesia feels like they are unable to be safely monitored.   Do not shave  48 hours prior to surgery.    Do not bring valuables to the hospital. [place].   Contacts, dentures or bridgework may not be worn into surgery.    Patients discharged on the day of surgery will not be allowed to drive home.  We recommend you have a responsible adult to stay with you for the first 24 hours after anesthesia.   Special Instructions: Bring a copy of your healthcare power of attorney and living will documents         the day of surgery if you haven't scanned them before.              Please read over the following fact sheets you were given: IF YOU HAVE QUESTIONS ABOUT YOUR PRE-OP INSTRUCTIONS PLEASE CALL [phone]     [address] [facility] - Preparing for Surgery Before surgery, you can play an important role.  Because skin is not sterile, your skin needs to be as free of germs as possible.  You can reduce the number of germs on your skin by washing with CHG (chlorahexidine gluconate) soap before surgery.  CHG is an antiseptic cleaner which kills germs and bonds with the skin to continue killing germs even after washing. Please DO NOT use if you have an allergy to CHG or antibacterial soaps.  If your skin becomes reddened/irritated stop using the CHG and inform your nurse when you arrive at Short Stay. Do not shave (including legs and underarms) for at least 48 hours prior to the first CHG  shower.  You may shave your face/neck.  Please follow these instructions carefully:  1.  Shower with CHG Soap the night before surgery and the  morning of surgery.  2.  If you choose to wash your hair, wash your hair first as usual with your normal  shampoo.  3.  After you shampoo, rinse your hair and body thoroughly to remove the shampoo.                             4.  Use CHG as you would any other liquid soap.  You can apply chg directly to the skin and wash.  Gently with a scrungie or clean washcloth.  5.  Apply the CHG Soap to your body ONLY FROM THE NECK DOWN.   Do   not use on face/ open  Wound or open sores. Avoid contact with eyes, ears mouth and   genitals (private parts).                       Wash face,  Genitals (private parts) with your normal soap.             6.  Wash thoroughly, paying special attention to the area where your    surgery  will be performed.  7.  Thoroughly rinse your body with warm water from the neck down.  8.  DO NOT shower/wash with your normal soap after using and rinsing off the CHG Soap.                9.  Pat yourself dry with a clean towel.            10.  Wear clean pajamas.            11.  Place clean sheets on your bed the night of your first shower and do not  sleep with pets. Day of Surgery : Do not apply any lotions/deodorants the morning of surgery.  Please wear clean clothes to the hospital/surgery center.  FAILURE TO FOLLOW THESE INSTRUCTIONS MAY RESULT IN THE CANCELLATION OF YOUR SURGERY  PATIENT SIGNATURE_________________________________  NURSE SIGNATURE__________________________________  ________________________________________________________________________   [name]  An incentive spirometer is a tool that can help keep your lungs clear and active. This tool measures how well you are filling your lungs with each breath. Taking long deep breaths may help reverse or decrease the chance of  developing breathing (pulmonary) problems (especially infection) following: A long period of time when you are unable to move or be active. BEFORE THE PROCEDURE  If the spirometer includes an indicator to show your best effort, your nurse or respiratory therapist will set it to a desired goal. If possible, sit up straight or lean slightly forward. Try not to slouch. Hold the incentive spirometer in an upright position. INSTRUCTIONS FOR USE  Sit on the edge of your bed if possible, or sit up as far as you can in bed or on a chair. Hold the incentive spirometer in an upright position. Breathe out normally. Place the mouthpiece in your mouth and seal your lips tightly around it. Breathe in slowly and as deeply as possible, raising the piston or the ball toward the top of the column. Hold your breath for 3-5 seconds or for as long as possible. Allow the piston or ball to fall to the bottom of the column. Remove the mouthpiece from your mouth and breathe out normally. Rest for a few seconds and repeat Steps 1 through 7 at least 10 times every 1-2 hours when you are awake. Take your time and take a few normal breaths between deep breaths. The spirometer may include an indicator to show your best effort. Use the indicator as a goal to work toward during each repetition. After each set of 10 deep breaths, practice coughing to be sure your lungs are clear. If you have an incision (the cut made at the time of surgery), support your incision when coughing by placing a pillow or rolled up towels firmly against it. Once you are able to get out of bed, walk around indoors and cough well. You may stop using the incentive spirometer when instructed by your caregiver.  RISKS AND COMPLICATIONS Take your time so you do not get dizzy or light-headed. If you are in pain, you may  need to take or ask for pain medication before doing incentive spirometry. It is harder to take a deep breath if you are having pain. AFTER  USE Rest and breathe slowly and easily. It can be helpful to keep track of a log of your progress. Your caregiver can provide you with a simple table to help with this. If you are using the spirometer at home, follow these instructions: [place] IF:  You are having difficultly using the spirometer. You have trouble using the spirometer as often as instructed. Your pain medication is not giving enough relief while using the spirometer. You develop fever of 100.5 F (38.1 C) or higher. SEEK IMMEDIATE MEDICAL CARE IF:  You cough up bloody sputum that had not been present before. You develop fever of 102 F (38.9 C) or greater. You develop worsening pain at or near the incision site. MAKE SURE YOU:  Understand these instructions. Will watch your condition. Will get help right away if you are not doing well or get worse. Document Released: 10/30/2006 Document Revised: 09/11/2011 Document Reviewed: 12/31/2006 Exit[employer] Patient Information 2014 [employer], [place].   ________________________________________________________________________  WHAT IS A BLOOD TRANSFUSION? Blood Transfusion Information  A transfusion is the replacement of blood or some of its parts. Blood is made up of multiple cells which provide different functions. Red blood cells carry oxygen and are used for blood loss replacement. White blood cells fight against infection. Platelets control bleeding. Plasma helps clot blood. Other blood products are available for specialized needs, such as hemophilia or other clotting disorders. BEFORE THE TRANSFUSION  Who gives blood for transfusions?  Healthy volunteers who are fully evaluated to make sure their blood is safe. This is blood bank blood. Transfusion therapy is the safest it has ever been in the practice of medicine. Before blood is taken from a donor, a complete history is taken to make sure that person has no history of diseases nor engages in risky social behavior  (examples are intravenous drug use or sexual activity with multiple partners). The donor's travel history is screened to minimize risk of transmitting infections, such as malaria. The donated blood is tested for signs of infectious diseases, such as HIV and hepatitis. The blood is then tested to be sure it is compatible with you in order to minimize the chance of a transfusion reaction. If you or a relative donates blood, this is often done in anticipation of surgery and is not appropriate for emergency situations. It takes many days to process the donated blood. RISKS AND COMPLICATIONS Although transfusion therapy is very safe and saves many lives, the main dangers of transfusion include:  Getting an infectious disease. Developing a transfusion reaction. This is an allergic reaction to something in the blood you were given. Every precaution is taken to prevent this. The decision to have a blood transfusion has been considered carefully by your caregiver before blood is given. Blood is not given unless the benefits outweigh the risks. AFTER THE TRANSFUSION Right after receiving a blood transfusion, you will usually feel much better and more energetic. This is especially true if your red blood cells have gotten low (anemic). The transfusion raises the level of the red blood cells which carry oxygen, and this usually causes an energy increase. The nurse administering the transfusion will monitor you carefully for complications. HOME CARE INSTRUCTIONS  No special instructions are needed after a transfusion. You may find your energy is better. Speak with your caregiver about any limitations on activity  for underlying diseases you may have. SEEK MEDICAL CARE IF:  Your condition is not improving after your transfusion. You develop redness or irritation at the intravenous (IV) site. SEEK IMMEDIATE MEDICAL CARE IF:  Any of the following symptoms occur over the next 12 hours: Shaking chills. You have a  temperature by mouth above 102 F (38.9 C), not controlled by medicine. Chest, back, or muscle pain. People around you feel you are not acting correctly or are confused. Shortness of breath or difficulty breathing. Dizziness and fainting. You get a rash or develop hives. You have a decrease in urine output. Your urine turns a dark color or changes to pink, red, or brown. Any of the following symptoms occur over the next 10 days: You have a temperature by mouth above 102 F (38.9 C), not controlled by medicine. Shortness of breath. Weakness after normal activity. The white part of the eye turns yellow (jaundice). You have a decrease in the amount of urine or are urinating less often. Your urine turns a dark color or changes to pink, red, or brown. Document Released: 06/16/2000 Document Revised: 09/11/2011 Document Reviewed: 02/03/2008 [facility] Patient Information 2014 [place], [place].  _______________________________________________________________________

## 2021-07-18 ENCOUNTER — Other Ambulatory Visit: Payer: Self-pay

## 2021-07-18 ENCOUNTER — Encounter (HOSPITAL_COMMUNITY): Payer: Self-pay

## 2021-07-18 ENCOUNTER — Encounter (HOSPITAL_COMMUNITY)
Admission: RE | Admit: 2021-07-18 | Discharge: 2021-07-18 | Disposition: A | Discharge status: Home / Self Care | Payer: Commercial Managed Care - HMO | Source: Ambulatory Visit | Attending: Orthopedic Surgery | Admitting: Orthopedic Surgery

## 2021-07-18 ENCOUNTER — Ambulatory Visit (HOSPITAL_COMMUNITY)
Admission: RE | Admit: 2021-07-18 | Discharge: 2021-07-18 | Disposition: A | Discharge status: Home / Self Care | Payer: Commercial Managed Care - HMO | Source: Ambulatory Visit | Attending: Orthopedic Surgery | Admitting: Orthopedic Surgery

## 2021-07-18 VITALS — BP 153/97 | HR 62 | Temp 98.3°F | Resp 16 | Ht 68.5 in | Wt 230.0 lb

## 2021-07-18 DIAGNOSIS — Z01818 Encounter for other preprocedural examination: Secondary | ICD-10-CM

## 2021-07-18 DIAGNOSIS — I10 Essential (primary) hypertension: Secondary | ICD-10-CM

## 2021-07-18 HISTORY — DX: Edema, unspecified: R60.9

## 2021-07-18 LAB — CBC
HCT: 38.3 % (ref 36.0–46.0)
Hemoglobin: 12.3 g/dL (ref 12.0–15.0)
MCH: 27.8 pg (ref 26.0–34.0)
MCHC: 32.1 g/dL (ref 30.0–36.0)
MCV: 86.5 fL (ref 80.0–100.0)
Platelets: 261 10*3/uL (ref 150–400)
RBC: 4.43 MIL/uL (ref 3.87–5.11)
RDW: 13.5 % (ref 11.5–15.5)
WBC: 4.6 10*3/uL (ref 4.0–10.5)
nRBC: 0 % (ref 0.0–0.2)

## 2021-07-18 LAB — BASIC METABOLIC PANEL
Anion gap: 6 (ref 5–15)
BUN: 9 mg/dL (ref 6–20)
CO2: 29 mmol/L (ref 22–32)
Calcium: 9.3 mg/dL (ref 8.9–10.3)
Chloride: 103 mmol/L (ref 98–111)
Creatinine, Ser: 0.67 mg/dL (ref 0.44–1.00)
GFR, Estimated: >60 mL/min (ref >60)
Glucose, Bld: 97 mg/dL (ref 70–99)
Potassium: 3.9 mmol/L (ref 3.5–5.1)
Sodium: 138 mmol/L (ref 135–145)

## 2021-07-18 LAB — SURGICAL PCR SCREEN
MRSA, PCR: NEGATIVE
Staphylococcus aureus: NEGATIVE

## 2021-07-18 NOTE — Progress Notes (Addendum)
PCP - Dr. Matilde Haymaker Cardiologist - Dr. Lizbeth Bark LOV 05-12-21 epic  PPM/ICD -  Device Orders -  Rep Notified -   Chest x-ray -  EKG - 05-12-21 Stress Test -  ECHO -  Cardiac Cath -   Sleep Study -  CPAP -   Fasting Blood Sugar -  Checks Blood Sugar _____ times a day  Blood Thinner Instructions: Aspirin Instructions:  ERAS Protcol - PRE-SURGERY Ensure or G2-   COVID TEST- N/A COVID vaccine -Moderna  x 5  Activity--Able to walk a flight of stairs with some Sob   not new since knee issues Anesthesia review:   Patient denies shortness of breath, fever, cough and chest pain at PAT appointment   All instructions explained to the patient, with a verbal understanding of the material. Patient agrees to go over the instructions while at home for a better understanding. Patient also instructed to self quarantine after being tested for COVID-19. The opportunity to ask questions was provided.

## 2021-07-21 DIAGNOSIS — M1711 Unilateral primary osteoarthritis, right knee: Secondary | ICD-10-CM | POA: Diagnosis present

## 2021-07-21 NOTE — H&P (Signed)
TOTAL KNEE ADMISSION H&P  Patient is being admitted for right total knee arthroplasty.  Subjective:  Chief Complaint:right knee pain.  HPI: [name], 61 y.o. female, has a history of pain and functional disability in the right knee due to arthritis and has failed non-surgical conservative treatments for greater than 12 weeks to includeNSAID's and/or analgesics, use of assistive devices, and activity modification.  Onset of symptoms was gradual, starting  several  years ago with gradually worsening course since that time. The patient noted no past surgery on the right knee(s).  Patient currently rates pain in the right knee(s) at 10 out of 10 with activity. Patient has night pain, worsening of pain with activity and weight bearing, pain that interferes with activities of daily living, pain with passive range of motion, and crepitus.  Patient has evidence of periarticular osteophytes and joint space narrowing by imaging studies. There is no active infection.  Patient Active Problem List   Diagnosis Date Noted   Osteoarthritis of right knee 07/21/2021   Protein-calorie malnutrition ([place]) 12/11/2016   S/P bariatric surgery 12/11/2016   Acid reflux 05/10/2015   Morbid obesity ([place]) 05/10/2015   Obstructive apnea 05/10/2015   Past Medical History:  Diagnosis Date   Anorectal pain    Arthritis of left knee    Bulging lumbar disc    Bunion, left    Cyst of right kidney    DJD (degenerative joint disease)    DVT (deep venous thrombosis) ([facility])    After Left knee surgery 2012   Edema    Fibroid    GERD (gastroesophageal reflux disease)    H/O endoscopy    H/O gastric sleeve    History of colon surgery    History of fasciotomy    left mid arch   History of hysterectomy 2005   History of left knee replacement    History of lumpectomy    History of revision of total replacement of left knee joint    Hypertension    borderline no meds   Lipedema    Metal plates in left arm     Morbid obesity (HCC)    Pinched nerve    Plantar fasciitis, left    Sleep apnea    very mild no cpap at this time   Vertigo     Past Surgical History:  Procedure Laterality Date   ABDOMINAL HYSTERECTOMY     BREAST SURGERY     lumps removed benign   ORIF RADIAL FRACTURE Left    REVISION TOTAL KNEE ARTHROPLASTY     TOTAL KNEE ARTHROPLASTY Left     No current facility-administered medications for this encounter.   Current Outpatient Medications  Medication Sig Dispense Refill Last Dose   Cholecalciferol (VITAMIN D) 125 MCG (5000 UT) CAPS Take 5,000 Units/day by mouth daily. 100 capsule 1    furosemide (LASIX) 20 MG tablet Take 20 mg by mouth daily as needed for edema.      levocetirizine (XYZAL) 5 MG tablet Take 5 mg by mouth daily as needed for allergies.  5    meclizine (ANTIVERT) 12.5 MG tablet Take 12.5-25 mg by mouth 2 (two) times daily as needed for dizziness.      Multiple Vitamins-Minerals (MULTIVITAMIN PO) Take 1 tablet by mouth daily.      omeprazole (PRILOSEC) 40 MG capsule Take 40 mg by mouth 2 (two) times daily.      Allergies  Allergen Reactions   Codeine Nausea And Vomiting  Social History   Tobacco Use   Smoking status: Former    Types: Cigarettes    Quit date: 1987    Years since quitting: 36.0   Smokeless tobacco: Never  Substance Use Topics   Alcohol use: Yes    Comment: social    Family History  Problem Relation Age of Onset   Hypertension Mother    Cervical cancer Mother    Hypertension Father    Diabetes Father    Throat cancer Father    Colon polyps Father    Hypertension Sister    Ovarian cancer Sister    Ovarian cancer Maternal Grandmother    Diabetes Paternal Grandmother      Review of Systems  Constitutional: Negative.   HENT: Negative.    Eyes: Negative.   Respiratory: Negative.    Cardiovascular:        HTN  Gastrointestinal: Negative.   Endocrine: Negative.   Genitourinary: Negative.   Musculoskeletal:  Positive for  arthralgias, joint swelling and myalgias.  Skin: Negative.   Allergic/Immunologic: Negative.   Neurological: Negative.   Hematological: Negative.   Psychiatric/Behavioral: Negative.     Objective:  Physical Exam Constitutional:      Appearance: Normal appearance. She is obese.  HENT:     Head: Normocephalic and atraumatic.     Nose: Nose normal.  Eyes:     Pupils: Pupils are equal, round, and reactive to light.  Cardiovascular:     Pulses: Normal pulses.  Pulmonary:     Effort: Pulmonary effort is normal.  Musculoskeletal:        General: Tenderness present.     Cervical back: Normal range of motion and neck supple.     Comments: Obvious varus deformity to the right knee tender along the medial joint line 5 forward flexion contracture flexion  to 120 with discomfort.  Surgical scar the left knee is well-healed no effusion collateral ligaments are stable range of motion 0/105.    Neurological:     General: No focal deficit present.     Mental Status: She is alert and oriented to person, place, and time. Mental status is at baseline.  Psychiatric:        Mood and Affect: Mood normal.        Behavior: Behavior normal.        Thought Content: Thought content normal.        Judgment: Judgment normal.    Vital signs in last 24 hours:    Labs:   Estimated body mass index is 34.46 kg/m as calculated from the following:   Height as of 07/18/21: 5' 8.5" (1.74 m).   Weight as of 07/18/21: 104.3 kg.   Imaging Review Plain radiographs demonstrate bilateral AP weightbearing, bilateral sunrise and lateral views are taken and reviewed in office today.  This shows end-stage bone-on-bone arthritis medial compartment right knee with periarticular osteophyte formation.  Patient's left total knee arthroplasty with long tibial stem appears be well-placed and well fixed.  I see no signs of prosthetic loosening.   Assessment/Plan:  End stage arthritis, right knee   The patient  history, physical examination, clinical judgment of the provider and imaging studies are consistent with end stage degenerative joint disease of the right knee(s) and total knee arthroplasty is deemed medically necessary. The treatment options including medical management, injection therapy arthroscopy and arthroplasty were discussed at length. The risks and benefits of total knee arthroplasty were presented and reviewed. The risks due to aseptic loosening, infection,  stiffness, patella tracking problems, thromboembolic complications and other imponderables were discussed. The patient acknowledged the explanation, agreed to proceed with the plan and consent was signed. Patient is being admitted for inpatient treatment for surgery, pain control, PT, OT, prophylactic antibiotics, VTE prophylaxis, progressive ambulation and ADL's and discharge planning. The patient is planning to be discharged home with home health services     Patient's anticipated LOS is less than 2 midnights, meeting these requirements: - Younger than 43 - Lives within 1 hour of care - Has a competent adult at home to recover with post-op recover - NO history of  - Chronic pain requiring opiods  - Diabetes  - Coronary Artery Disease  - Heart failure  - Heart attack  - Stroke  - DVT/VTE  - Cardiac arrhythmia  - Respiratory Failure/COPD  - Renal failure  - Anemia  - Advanced Liver disease

## 2021-07-25 ENCOUNTER — Ambulatory Visit (HOSPITAL_COMMUNITY): Payer: Commercial Managed Care - HMO | Admitting: Certified Registered Nurse Anesthetist

## 2021-07-25 ENCOUNTER — Observation Stay (HOSPITAL_COMMUNITY)
Admission: RE | Admit: 2021-07-25 | Discharge: 2021-07-27 | Disposition: A | Discharge status: Home / Self Care | Payer: Commercial Managed Care - HMO | Attending: Orthopedic Surgery | Admitting: Orthopedic Surgery

## 2021-07-25 ENCOUNTER — Encounter (HOSPITAL_COMMUNITY)
Admission: RE | Disposition: A | Discharge status: Home / Self Care | Payer: Self-pay | Source: Home / Self Care | Attending: Orthopedic Surgery

## 2021-07-25 ENCOUNTER — Encounter (HOSPITAL_COMMUNITY): Payer: Self-pay | Admitting: Orthopedic Surgery

## 2021-07-25 ENCOUNTER — Other Ambulatory Visit: Payer: Self-pay

## 2021-07-25 DIAGNOSIS — Z96651 Presence of right artificial knee joint: Secondary | ICD-10-CM

## 2021-07-25 DIAGNOSIS — Z96652 Presence of left artificial knee joint: Secondary | ICD-10-CM | POA: Insufficient documentation

## 2021-07-25 DIAGNOSIS — M1711 Unilateral primary osteoarthritis, right knee: Principal | ICD-10-CM | POA: Insufficient documentation

## 2021-07-25 DIAGNOSIS — Z87891 Personal history of nicotine dependence: Secondary | ICD-10-CM | POA: Insufficient documentation

## 2021-07-25 DIAGNOSIS — I10 Essential (primary) hypertension: Secondary | ICD-10-CM | POA: Insufficient documentation

## 2021-07-25 HISTORY — PX: TOTAL KNEE ARTHROPLASTY: SHX125

## 2021-07-25 LAB — TYPE AND SCREEN
ABO/RH(D): AB POS
Antibody Screen: NEGATIVE

## 2021-07-25 SURGERY — ARTHROPLASTY, KNEE, TOTAL
Anesthesia: Spinal | Site: Knee | Laterality: Right

## 2021-07-25 MED ORDER — PROPOFOL 500 MG/50ML IV EMUL
INTRAVENOUS | Status: AC
  Filled 2021-07-25: qty 50

## 2021-07-25 MED ORDER — FENTANYL CITRATE PF 50 MCG/ML IJ SOSY
PREFILLED_SYRINGE | INTRAMUSCULAR | Status: AC
  Administered 2021-07-25: 50 ug via INTRAVENOUS
  Filled 2021-07-25: qty 2

## 2021-07-25 MED ORDER — ORAL CARE MOUTH RINSE: 15.0000 mL | Freq: Once | OROMUCOSAL | Status: AC

## 2021-07-25 MED ORDER — TIZANIDINE HCL 2 MG PO CAPS: 4.0000 mg | ORAL_CAPSULE | Freq: Three times a day (TID) | ORAL | 0 refills | Status: AC | PRN

## 2021-07-25 MED ORDER — TRANEXAMIC ACID 1000 MG/10ML IV SOLN
INTRAVENOUS | Status: DC | PRN
  Administered 2021-07-25: 2000 mg via TOPICAL

## 2021-07-25 MED ORDER — DIPHENHYDRAMINE HCL 12.5 MG/5ML PO ELIX: 12.5000 mg | ORAL_SOLUTION | ORAL | Status: DC | PRN

## 2021-07-25 MED ORDER — ACETAMINOPHEN 325 MG PO TABS
325.0000 mg | ORAL_TABLET | Freq: Four times a day (QID) | ORAL | Status: DC | PRN
  Administered 2021-07-27: 13:00:00 650 mg via ORAL
  Filled 2021-07-25: qty 2

## 2021-07-25 MED ORDER — BUPIVACAINE LIPOSOME 1.3 % IJ SUSP
INTRAMUSCULAR | Status: AC
  Filled 2021-07-25: qty 20

## 2021-07-25 MED ORDER — ROPIVACAINE HCL 5 MG/ML IJ SOLN
INTRAMUSCULAR | Status: DC | PRN
  Administered 2021-07-25: 20 mL via PERINEURAL

## 2021-07-25 MED ORDER — TRANEXAMIC ACID-NACL 1000-0.7 MG/100ML-% IV SOLN
INTRAVENOUS | Status: AC
  Filled 2021-07-25: qty 100

## 2021-07-25 MED ORDER — FENTANYL CITRATE PF 50 MCG/ML IJ SOSY: 50.0000 ug | PREFILLED_SYRINGE | INTRAMUSCULAR | Status: DC

## 2021-07-25 MED ORDER — LEVOCETIRIZINE DIHYDROCHLORIDE 5 MG PO TABS: 5.0000 mg | ORAL_TABLET | Freq: Every day | ORAL | Status: DC | PRN

## 2021-07-25 MED ORDER — HYDROMORPHONE HCL 1 MG/ML IJ SOLN
0.5000 mg | INTRAMUSCULAR | Status: DC | PRN
  Administered 2021-07-25 – 2021-07-27 (×8): 1 mg via INTRAVENOUS
  Filled 2021-07-25 (×8): qty 1

## 2021-07-25 MED ORDER — PANTOPRAZOLE SODIUM 40 MG PO TBEC
40.0000 mg | DELAYED_RELEASE_TABLET | Freq: Every day | ORAL | Status: DC
  Administered 2021-07-25 – 2021-07-27 (×3): 40 mg via ORAL
  Filled 2021-07-25 (×3): qty 1

## 2021-07-25 MED ORDER — BISACODYL 5 MG PO TBEC: 5.0000 mg | DELAYED_RELEASE_TABLET | Freq: Every day | ORAL | Status: DC | PRN

## 2021-07-25 MED ORDER — OXYCODONE HCL 5 MG PO TABS
5.0000 mg | ORAL_TABLET | ORAL | Status: DC | PRN
  Administered 2021-07-25 – 2021-07-27 (×10): 10 mg via ORAL
  Filled 2021-07-25 (×6): qty 2
  Filled 2021-07-25 (×2): qty 1
  Filled 2021-07-25 (×2): qty 2

## 2021-07-25 MED ORDER — KCL IN DEXTROSE-NACL 20-5-0.45 MEQ/L-%-% IV SOLN
INTRAVENOUS | Status: DC
  Filled 2021-07-25 (×5): qty 1000

## 2021-07-25 MED ORDER — DEXAMETHASONE SODIUM PHOSPHATE 10 MG/ML IJ SOLN
INTRAMUSCULAR | Status: AC
  Filled 2021-07-25: qty 1

## 2021-07-25 MED ORDER — OXYCODONE HCL 5 MG PO TABS: 5.0000 mg | ORAL_TABLET | ORAL | 0 refills | Status: AC | PRN

## 2021-07-25 MED ORDER — SODIUM CHLORIDE (PF) 0.9 % IJ SOLN
INTRAMUSCULAR | Status: DC | PRN
  Administered 2021-07-25: 50 mL

## 2021-07-25 MED ORDER — PROPOFOL 10 MG/ML IV BOLUS
INTRAVENOUS | Status: DC | PRN
Start: 2021-07-25 — End: 2021-07-25
  Administered 2021-07-25: 40 mg via INTRAVENOUS
  Administered 2021-07-25: 20 mg via INTRAVENOUS

## 2021-07-25 MED ORDER — AMISULPRIDE (ANTIEMETIC) 5 MG/2ML IV SOLN: 10.0000 mg | Freq: Once | INTRAVENOUS | Status: AC

## 2021-07-25 MED ORDER — PROPOFOL 1000 MG/100ML IV EMUL
INTRAVENOUS | Status: AC
  Filled 2021-07-25: qty 100

## 2021-07-25 MED ORDER — ALUM & MAG HYDROXIDE-SIMETH 200-200-20 MG/5ML PO SUSP: 30.0000 mL | ORAL | Status: DC | PRN

## 2021-07-25 MED ORDER — PROPOFOL 500 MG/50ML IV EMUL
INTRAVENOUS | Status: DC | PRN
  Administered 2021-07-25: 100 ug/kg/min via INTRAVENOUS

## 2021-07-25 MED ORDER — LACTATED RINGERS IV BOLUS
500.0000 mL | Freq: Once | INTRAVENOUS | Status: AC
  Administered 2021-07-25: 500 mL via INTRAVENOUS

## 2021-07-25 MED ORDER — MECLIZINE HCL 25 MG PO TABS: 12.5000 mg | ORAL_TABLET | Freq: Two times a day (BID) | ORAL | Status: DC | PRN

## 2021-07-25 MED ORDER — WATER FOR IRRIGATION, STERILE IR SOLN
Status: DC | PRN
  Administered 2021-07-25: 2000 mL

## 2021-07-25 MED ORDER — MENTHOL 3 MG MT LOZG: 1.0000 | LOZENGE | OROMUCOSAL | Status: DC | PRN

## 2021-07-25 MED ORDER — ACETAMINOPHEN 10 MG/ML IV SOLN: 1000.0000 mg | Freq: Once | INTRAVENOUS | Status: DC | PRN

## 2021-07-25 MED ORDER — POLYETHYLENE GLYCOL 3350 17 G PO PACK
17.0000 g | PACK | Freq: Every day | ORAL | Status: DC | PRN
Start: 2021-07-25 — End: 2021-07-27

## 2021-07-25 MED ORDER — LACTATED RINGERS IV BOLUS: 250.0000 mL | Freq: Once | INTRAVENOUS | Status: DC

## 2021-07-25 MED ORDER — BUPIVACAINE-EPINEPHRINE (PF) 0.25% -1:200000 IJ SOLN
INTRAMUSCULAR | Status: AC
  Filled 2021-07-25: qty 30

## 2021-07-25 MED ORDER — PHENYLEPHRINE HCL-NACL 20-0.9 MG/250ML-% IV SOLN
INTRAVENOUS | Status: DC | PRN
  Administered 2021-07-25: 25 ug/min via INTRAVENOUS

## 2021-07-25 MED ORDER — METOCLOPRAMIDE HCL 5 MG PO TABS: 5.0000 mg | ORAL_TABLET | Freq: Three times a day (TID) | ORAL | Status: DC | PRN

## 2021-07-25 MED ORDER — MIDAZOLAM HCL 2 MG/2ML IJ SOLN: 1.0000 mg | INTRAMUSCULAR | Status: DC

## 2021-07-25 MED ORDER — LACTATED RINGERS IV SOLN: INTRAVENOUS | Status: DC

## 2021-07-25 MED ORDER — MIDAZOLAM HCL 2 MG/2ML IJ SOLN
INTRAMUSCULAR | Status: AC
  Administered 2021-07-25: 1 mg via INTRAVENOUS
  Filled 2021-07-25: qty 2

## 2021-07-25 MED ORDER — ONDANSETRON HCL 4 MG/2ML IJ SOLN: 4.0000 mg | Freq: Once | INTRAMUSCULAR | Status: DC | PRN

## 2021-07-25 MED ORDER — LORATADINE 10 MG PO TABS: 10.0000 mg | ORAL_TABLET | Freq: Every day | ORAL | Status: DC | PRN

## 2021-07-25 MED ORDER — BUPIVACAINE IN DEXTROSE 0.75-8.25 % IT SOLN
INTRATHECAL | Status: DC | PRN
Start: 2021-07-25 — End: 2021-07-25
  Administered 2021-07-25: 1.6 mL via INTRATHECAL

## 2021-07-25 MED ORDER — HYDROMORPHONE HCL 1 MG/ML IJ SOLN
0.2500 mg | INTRAMUSCULAR | Status: DC | PRN
  Administered 2021-07-25 (×2): 0.5 mg via INTRAVENOUS

## 2021-07-25 MED ORDER — PHENOL 1.4 % MT LIQD: 1.0000 | OROMUCOSAL | Status: DC | PRN

## 2021-07-25 MED ORDER — METOCLOPRAMIDE HCL 5 MG/ML IJ SOLN: 5.0000 mg | Freq: Three times a day (TID) | INTRAMUSCULAR | Status: DC | PRN

## 2021-07-25 MED ORDER — BUPIVACAINE-EPINEPHRINE (PF) 0.25% -1:200000 IJ SOLN
INTRAMUSCULAR | Status: DC | PRN
  Administered 2021-07-25: 30 mL

## 2021-07-25 MED ORDER — TRANEXAMIC ACID 1000 MG/10ML IV SOLN
2000.0000 mg | INTRAVENOUS | Status: DC
  Filled 2021-07-25: qty 20

## 2021-07-25 MED ORDER — ASPIRIN EC 325 MG PO TBEC: 325.0000 mg | DELAYED_RELEASE_TABLET | Freq: Two times a day (BID) | ORAL | 0 refills | Status: AC

## 2021-07-25 MED ORDER — LACTATED RINGERS IV BOLUS
250.0000 mL | Freq: Once | INTRAVENOUS | Status: AC
  Administered 2021-07-25: 250 mL via INTRAVENOUS

## 2021-07-25 MED ORDER — ONDANSETRON HCL 4 MG/2ML IJ SOLN
INTRAMUSCULAR | Status: AC
  Filled 2021-07-25: qty 2

## 2021-07-25 MED ORDER — BUPIVACAINE LIPOSOME 1.3 % IJ SUSP
INTRAMUSCULAR | Status: DC | PRN
  Administered 2021-07-25: 20 mL

## 2021-07-25 MED ORDER — HYDROMORPHONE HCL 1 MG/ML IJ SOLN
INTRAMUSCULAR | Status: AC
  Administered 2021-07-25: 0.5 mg via INTRAVENOUS
  Filled 2021-07-25: qty 2

## 2021-07-25 MED ORDER — TRANEXAMIC ACID-NACL 1000-0.7 MG/100ML-% IV SOLN
1000.0000 mg | INTRAVENOUS | Status: AC
  Administered 2021-07-25: 1000 mg via INTRAVENOUS

## 2021-07-25 MED ORDER — CEFAZOLIN SODIUM-DEXTROSE 2-4 GM/100ML-% IV SOLN
2.0000 g | INTRAVENOUS | Status: AC
  Administered 2021-07-25: 2 g via INTRAVENOUS
  Filled 2021-07-25: qty 100

## 2021-07-25 MED ORDER — DOCUSATE SODIUM 100 MG PO CAPS
100.0000 mg | ORAL_CAPSULE | Freq: Two times a day (BID) | ORAL | Status: DC
  Administered 2021-07-25 – 2021-07-27 (×4): 100 mg via ORAL
  Filled 2021-07-25 (×4): qty 1

## 2021-07-25 MED ORDER — ONDANSETRON HCL 4 MG PO TABS
4.0000 mg | ORAL_TABLET | Freq: Four times a day (QID) | ORAL | Status: DC | PRN
  Administered 2021-07-27: 13:00:00 4 mg via ORAL
  Filled 2021-07-25: qty 1

## 2021-07-25 MED ORDER — CHLORHEXIDINE GLUCONATE 0.12 % MT SOLN
15.0000 mL | Freq: Once | OROMUCOSAL | Status: AC
  Administered 2021-07-25: 15 mL via OROMUCOSAL

## 2021-07-25 MED ORDER — PANTOPRAZOLE SODIUM 40 MG PO TBEC
80.0000 mg | DELAYED_RELEASE_TABLET | Freq: Every day | ORAL | Status: DC
  Administered 2021-07-26 – 2021-07-27 (×2): 80 mg via ORAL
  Filled 2021-07-25 (×2): qty 2

## 2021-07-25 MED ORDER — BUPIVACAINE LIPOSOME 1.3 % IJ SUSP: 20.0000 mL | Freq: Once | INTRAMUSCULAR | Status: DC

## 2021-07-25 MED ORDER — ADULT MULTIVITAMIN W/MINERALS CH
ORAL_TABLET | Freq: Every day | ORAL | Status: DC
  Administered 2021-07-26 – 2021-07-27 (×2): 1 via ORAL
  Filled 2021-07-25 (×2): qty 1

## 2021-07-25 MED ORDER — VITAMIN D 25 MCG (1000 UNIT) PO TABS
5000.0000 [IU]/d | ORAL_TABLET | Freq: Every day | ORAL | Status: DC
  Administered 2021-07-26 – 2021-07-27 (×2): 5000 [IU] via ORAL
  Filled 2021-07-25 (×2): qty 5

## 2021-07-25 MED ORDER — TRANEXAMIC ACID-NACL 1000-0.7 MG/100ML-% IV SOLN: 1000.0000 mg | Freq: Once | INTRAVENOUS | Status: DC

## 2021-07-25 MED ORDER — SODIUM CHLORIDE 0.9 % IR SOLN
Status: DC | PRN
  Administered 2021-07-25 (×2): 1000 mL

## 2021-07-25 MED ORDER — ASPIRIN 81 MG PO CHEW
81.0000 mg | CHEWABLE_TABLET | Freq: Two times a day (BID) | ORAL | Status: DC
  Administered 2021-07-25 – 2021-07-27 (×4): 81 mg via ORAL
  Filled 2021-07-25 (×4): qty 1

## 2021-07-25 MED ORDER — FUROSEMIDE 20 MG PO TABS: 20.0000 mg | ORAL_TABLET | Freq: Every day | ORAL | Status: DC | PRN

## 2021-07-25 MED ORDER — FLEET ENEMA 7-19 GM/118ML RE ENEM: 1.0000 | ENEMA | Freq: Once | RECTAL | Status: DC | PRN

## 2021-07-25 MED ORDER — ONDANSETRON HCL 4 MG/2ML IJ SOLN
INTRAMUSCULAR | Status: DC | PRN
Start: 2021-07-25 — End: 2021-07-25
  Administered 2021-07-25: 4 mg via INTRAVENOUS

## 2021-07-25 MED ORDER — OXYCODONE HCL 5 MG PO TABS
ORAL_TABLET | ORAL | Status: AC
  Filled 2021-07-25: qty 2

## 2021-07-25 MED ORDER — AMISULPRIDE (ANTIEMETIC) 5 MG/2ML IV SOLN
INTRAVENOUS | Status: AC
  Administered 2021-07-25: 10 mg via INTRAVENOUS
  Filled 2021-07-25: qty 4

## 2021-07-25 MED ORDER — POVIDONE-IODINE 10 % EX SWAB
2.0000 "application " | Freq: Once | CUTANEOUS | Status: AC
  Administered 2021-07-25: 2 via TOPICAL

## 2021-07-25 MED ORDER — ONDANSETRON HCL 4 MG/2ML IJ SOLN
4.0000 mg | Freq: Four times a day (QID) | INTRAMUSCULAR | Status: DC | PRN
  Administered 2021-07-26: 09:00:00 4 mg via INTRAVENOUS
  Filled 2021-07-25: qty 2

## 2021-07-25 SURGICAL SUPPLY — 49 items
ATTUNE MED DOME PAT 41 KNEE (Knees) ×1 IMPLANT
ATTUNE PS FEM RT SZ 7 CEM KNEE (Femur) ×1 IMPLANT
ATTUNE PSRP INSR SZ7 8 KNEE (Insert) ×1 IMPLANT
BAG COUNTER SPONGE SURGICOUNT (BAG) IMPLANT
BAG DECANTER FOR FLEXI CONT (MISCELLANEOUS) ×2 IMPLANT
BAG ZIPLOCK 12X15 (MISCELLANEOUS) ×2 IMPLANT
BASE TIBIAL ROT PLAT SZ 7 KNEE (Knees) IMPLANT
BLADE SAG 18X100X1.27 (BLADE) ×2 IMPLANT
BLADE SAW SGTL 11.0X1.19X90.0M (BLADE) ×2 IMPLANT
BLADE SURG SZ10 CARB STEEL (BLADE) ×4 IMPLANT
BNDG ELASTIC 6X10 VLCR STRL LF (GAUZE/BANDAGES/DRESSINGS) ×2 IMPLANT
BOWL SMART MIX CTS (DISPOSABLE) ×2 IMPLANT
CEMENT HV SMART SET (Cement) ×3 IMPLANT
COVER SURGICAL LIGHT HANDLE (MISCELLANEOUS) ×2 IMPLANT
CUFF TOURN SGL QUICK 34 (TOURNIQUET CUFF) ×2
CUFF TRNQT CYL 34X4.125X (TOURNIQUET CUFF) ×1 IMPLANT
DECANTER SPIKE VIAL GLASS SM (MISCELLANEOUS) ×6 IMPLANT
DRAPE INCISE IOBAN 66X45 STRL (DRAPES) ×2 IMPLANT
DRAPE U-SHAPE 47X51 STRL (DRAPES) ×2 IMPLANT
DRESSING AQUACEL AG SP 3.5X10 (GAUZE/BANDAGES/DRESSINGS) IMPLANT
DRSG AQUACEL AG ADV 3.5X10 (GAUZE/BANDAGES/DRESSINGS) ×2 IMPLANT
DRSG AQUACEL AG SP 3.5X10 (GAUZE/BANDAGES/DRESSINGS) ×2
DURAPREP 26ML APPLICATOR (WOUND CARE) ×2 IMPLANT
ELECT REM PT RETURN 15FT ADLT (MISCELLANEOUS) ×2 IMPLANT
GLOVE SRG 8 PF TXTR STRL LF DI (GLOVE) ×1 IMPLANT
GLOVE SURG ENC MOIS LTX SZ7.5 (GLOVE) ×2 IMPLANT
GLOVE SURG ENC MOIS LTX SZ8.5 (GLOVE) ×2 IMPLANT
GLOVE SURG UNDER POLY LF SZ8 (GLOVE) ×2
GLOVE SURG UNDER POLY LF SZ9 (GLOVE) ×2 IMPLANT
GOWN STRL REUS W/TWL XL LVL3 (GOWN DISPOSABLE) ×4 IMPLANT
HANDPIECE INTERPULSE COAX TIP (DISPOSABLE) ×2
HOOD PEEL AWAY FLYTE STAYCOOL (MISCELLANEOUS) ×6 IMPLANT
KIT TURNOVER KIT A (KITS) IMPLANT
NDL HYPO 21X1.5 SAFETY (NEEDLE) ×2 IMPLANT
NEEDLE HYPO 21X1.5 SAFETY (NEEDLE) ×4 IMPLANT
NS IRRIG 1000ML POUR BTL (IV SOLUTION) ×2 IMPLANT
PACK TOTAL KNEE CUSTOM (KITS) ×2 IMPLANT
PROTECTOR NERVE ULNAR (MISCELLANEOUS) ×2 IMPLANT
SET HNDPC FAN SPRY TIP SCT (DISPOSABLE) ×1 IMPLANT
SUT VIC AB 1 CTX 36 (SUTURE) ×2
SUT VIC AB 1 CTX36XBRD ANBCTR (SUTURE) ×1 IMPLANT
SUT VIC AB 3-0 CT1 27 (SUTURE) ×6
SUT VIC AB 3-0 CT1 TAPERPNT 27 (SUTURE) ×3 IMPLANT
SYR CONTROL 10ML LL (SYRINGE) ×4 IMPLANT
TIBIAL BASE ROT PLAT SZ 7 KNEE (Knees) ×2 IMPLANT
TRAY CATH INTERMITTENT SS 16FR (CATHETERS) IMPLANT
TRAY FOLEY MTR SLVR 16FR STAT (SET/KITS/TRAYS/PACK) ×2 IMPLANT
WATER STERILE IRR 1000ML POUR (IV SOLUTION) ×4 IMPLANT
WRAP KNEE MAXI GEL POST OP (GAUZE/BANDAGES/DRESSINGS) ×2 IMPLANT

## 2021-07-25 NOTE — Progress Notes (Signed)
Assisted Dr. Rose with right, ultrasound guided, adductor canal block. Side rails up, monitors on throughout procedure. See vital signs in flow sheet. Tolerated Procedure well.  

## 2021-07-25 NOTE — Anesthesia Procedure Notes (Signed)
Spinal  Patient location during procedure: OR Start time: 07/25/2021 10:20 AM End time: 07/25/2021 10:26 AM Reason for block: surgical anesthesia Staffing Performed: anesthesiologist  Anesthesiologist: Myrtie Soman, MD Preanesthetic Checklist Completed: patient identified, IV checked, site marked, risks and benefits discussed, surgical consent, monitors and equipment checked, pre-op evaluation and timeout performed Spinal Block Patient position: sitting Prep: Betadine Patient monitoring: heart rate, continuous pulse ox and blood pressure Approach: midline Location: L3-4 Injection technique: single-shot Needle Needle type: Sprotte  Needle gauge: 24 G Needle length: 9 cm Assessment Sensory level: T6 Events: CSF return and second provider Additional Notes

## 2021-07-25 NOTE — Discharge Instructions (Signed)

## 2021-07-25 NOTE — Evaluation (Signed)
Physical Therapy Evaluation Patient Details Name: [name] MRN: [id number] DOB: August 21, 1960 Today's Date: 07/25/2021  History of Present Illness  Pt is a 61 y.o. female s/p Rt TKA on 07/25/21.  PMh significnat for DVT, GERD, HTN, vertigo, ORIF (L radial fractue), Lt TKA.  Clinical Impression  Pt is POD #0 s/p Rt TKA resulting in the deficits listed below (see PT Problem List). Pt performed sit to stand transfers with MIN A for safety and cues for safe hand placement. Pt with increased pain levels throughout session and onset of dizziness and nausea, unable to safely progress to ambulation this session. PT reviewed LE interventions for promotion of circulation/DVT prevention, pt demonstrated understanding. Pt will have intermittent assist from her husband upon d/c. Pt will benefit from skilled PT to maximize functional mobility to increase independence.         Recommendations for follow up therapy are one component of a multi-disciplinary discharge planning process, led by the attending physician.  Recommendations may be updated based on patient status, additional functional criteria and insurance authorization.  Follow Up Recommendations Follow physician's recommendations for discharge plan and follow up therapies    Assistance Recommended at Discharge Frequent or constant Supervision/Assistance  Patient can return home with the following  A little help with walking and/or transfers;A little help with bathing/dressing/bathroom;Assistance with cooking/housework;Assist for transportation;Help with stairs or ramp for entrance    Equipment Recommendations Rolling walker (2 wheels)  Recommendations for Other Services       Functional Status Assessment Patient has had a recent decline in their functional status and demonstrates the ability to make significant improvements in function in a reasonable and predictable amount of time.     Precautions / Restrictions  Precautions Precautions: Fall Restrictions Weight Bearing Restrictions: No      Mobility  Bed Mobility Overal bed mobility: Needs Assistance Bed Mobility: Supine to Sit, Sit to Supine     Supine to sit: Min guard, HOB elevated Sit to supine: Min guard, HOB elevated   General bed mobility comments: Pt able to utilize gait belt loop around Rt LE to asisst with progress into/out of bed also with use of bed rails. Pt with mild lightheadedness BP sitting EOB 110/90. Pt vocalizing out in pain while seated EOB and becoming tearful stating "I don't like feeling like this"    Transfers Overall transfer level: Needs assistance Equipment used: Rolling walker (2 wheels) Transfers: Sit to/from Stand Sit to Stand: Min assist           General transfer comment: MIN A for power up to stand with cues for safe hand placement. Pt with increased pain and vocalizing in pain throughout session. Pt with increasing nausea and dizziness while standing, BP reading 107/49. Pt assisted back to supine and BP 126/80 after rest.    Ambulation/Gait                  Stairs            Wheelchair Mobility    Modified Rankin (Stroke Patients Only)       Balance Overall balance assessment: Needs assistance Sitting-balance support: Single extremity supported, Feet supported Sitting balance-Leahy Scale: Fair     Standing balance support: Bilateral upper extremity supported, Reliant on assistive device for balance Standing balance-Leahy Scale: Poor                               Pertinent Vitals/Pain  Pain Assessment Pain Assessment: 0-10 Pain Score: 10-Worst pain ever Pain Location: Rt knee Pain Descriptors / Indicators: Discomfort, Grimacing, Sore, Tender Pain Intervention(s): Limited activity within patient's tolerance, Monitored during session, Premedicated before session, Repositioned    Home Living Family/patient expects to be discharged to:: Private  residence Living Arrangements: Spouse/significant other;Children Available Help at Discharge: Family Type of Home: House Home Access: Stairs to enter Entrance Stairs-Rails: Left Entrance Stairs-Number of Steps: 3 (2 and then threshold)   Home Layout: One level Home Equipment: Cane - single point;Grab bars - toilet Additional Comments: Pt lives with husband, reports he works during the day and sister lives close, works during the day gets off at [employer] and will check on pt after work. Reports husband works until [employer] and plans to go back to work tomorrow per pt report.    Prior Function Prior Level of Function : Independent/Modified Independent                     Hand Dominance        Extremity/Trunk Assessment   Upper Extremity Assessment Upper Extremity Assessment: Overall WFL for tasks assessed    Lower Extremity Assessment Lower Extremity Assessment: RLE deficits/detail RLE Deficits / Details: Fair quad set strength. Pt unable to perform SLR in supine due to increased pain. Able to perform limited ROM LAQ while seated EOB. R DF/PF strength 4-/5, L DF/PF strength 5/5. RLE: Unable to fully assess due to pain    Cervical / Trunk Assessment Cervical / Trunk Assessment: Normal  Communication   Communication: No difficulties  Cognition Arousal/Alertness: Awake/alert Behavior During Therapy: WFL for tasks assessed/performed Overall Cognitive Status: Within Functional Limits for tasks assessed                                          General Comments      Exercises Total Joint Exercises Ankle Circles/Pumps: AROM, 15 reps, Supine   Assessment/Plan    PT Assessment Patient needs continued PT services  PT Problem List Decreased strength;Decreased range of motion;Decreased activity tolerance;Decreased balance;Decreased mobility;Decreased knowledge of use of DME;Pain       PT Treatment Interventions DME instruction;Gait training;Stair  training;Functional mobility training;Therapeutic activities;Therapeutic exercise;Balance training;Patient/family education    PT Goals (Current goals can be found in the Care Plan section)  Acute Rehab PT Goals Patient Stated Goal: feel better and go home PT Goal Formulation: With patient Time For Goal Achievement: 08/08/21 Potential to Achieve Goals: Good    Frequency 7X/week     Co-evaluation               AM-PAC PT "6 Clicks" Mobility  Outcome Measure Help needed turning from your back to your side while in a flat bed without using bedrails?: A Little Help needed moving from lying on your back to sitting on the side of a flat bed without using bedrails?: A Little Help needed moving to and from a bed to a chair (including a wheelchair)?: A Little Help needed standing up from a chair using your arms (e.g., wheelchair or bedside chair)?: A Little Help needed to walk in hospital room?: Total Help needed climbing 3-5 steps with a railing? : Total 6 Click Score: 14    End of Session Equipment Utilized During Treatment: Gait belt Activity Tolerance: Patient limited by pain;Treatment limited secondary to medical complications (Comment) (onset of nausea and  dizziness) Patient left: in bed;with call bell/phone within reach;with nursing/sitter in room Nurse Communication: Mobility status;Other (comment) (Pt pain level and nausea/lightheadedness) PT Visit Diagnosis: Unsteadiness on feet (R26.81);Muscle weakness (generalized) (M62.81);Pain;Difficulty in walking, not elsewhere classified (R26.2) Pain - Right/Left: Right Pain - part of body: Knee    Time: 1460-4799 PT Time Calculation (min) (ACUTE ONLY): 34 min   Charges:   PT Evaluation $PT Eval Low Complexity: 1 Low PT Treatments $Therapeutic Activity: 8-22 mins        {Mayrin Schmuck Y., PT, DPT  Acute Rehabilitation Services  Office [phone]   07/25/2021, 5:08 PM

## 2021-07-25 NOTE — Anesthesia Postprocedure Evaluation (Signed)
Anesthesia Post Note  Patient: [name]  Procedure(s) Performed: RIGHT TOTAL KNEE ARTHROPLASTY (Right: Knee)     Patient location during evaluation: PACU Anesthesia Type: Spinal Level of consciousness: oriented and awake and alert Pain management: pain level controlled Vital Signs Assessment: post-procedure vital signs reviewed and stable Respiratory status: spontaneous breathing, respiratory function stable and patient connected to nasal cannula oxygen Cardiovascular status: blood pressure returned to baseline and stable Postop Assessment: no headache, no backache and no apparent nausea or vomiting Anesthetic complications: no   No notable events documented.  Last Vitals:  Vitals:   07/25/21 1345 07/25/21 1400  BP: (!) 137/40 120/62  Pulse: (!) 52 60  Resp: 16 18  Temp:  36.6 C  SpO2: 100% 99%    Last Pain:  Vitals:   07/25/21 1400  TempSrc:   PainSc: 6     LLE Motor Response: Purposeful movement (07/25/21 1400)   RLE Motor Response: Purposeful movement (07/25/21 1400)   L Sensory Level: S1-Sole of foot, small toes (07/25/21 1400) R Sensory Level: S1-Sole of foot, small toes (07/25/21 1400)  Richerd Grime S

## 2021-07-25 NOTE — Interval H&P Note (Signed)
History and Physical Interval Note:  07/25/2021 9:01 AM  [name]  has presented today for surgery, with the diagnosis of RIGHT KNEE OSTEOARTHRITIS.  The various methods of treatment have been discussed with the patient and family. After consideration of risks, benefits and other options for treatment, the patient has consented to  Procedure(s): RIGHT TOTAL KNEE ARTHROPLASTY (Right) as a surgical intervention.  The patient's history has been reviewed, patient examined, no change in status, stable for surgery.  I have reviewed the patient's chart and labs.  Questions were answered to the patient's satisfaction.     Kerin Salen

## 2021-07-25 NOTE — Anesthesia Procedure Notes (Signed)
Procedure Name: MAC Date/Time: 07/25/2021 10:17 AM Performed by: Maxwell Caul, CRNA Pre-anesthesia Checklist: Patient identified, Emergency Drugs available, Suction available and Patient being monitored Patient Re-evaluated:Patient Re-evaluated prior to induction Oxygen Delivery Method: Simple face mask

## 2021-07-25 NOTE — Transfer of Care (Signed)
Immediate Anesthesia Transfer of Care Note  Patient: [name]  Procedure(s) Performed: RIGHT TOTAL KNEE ARTHROPLASTY (Right: Knee)  Patient Location: PACU  Anesthesia Type:Spinal  Level of Consciousness: awake, alert  and oriented  Airway & Oxygen Therapy: Patient Spontanous Breathing and Patient connected to face mask oxygen  Post-op Assessment: Report given to RN and Post -op Vital signs reviewed and stable  Post vital signs: Reviewed and stable  Last Vitals:  Vitals Value Taken Time  BP 108/71 07/25/21 1221  Temp    Pulse 59 07/25/21 1222  Resp 17 07/25/21 1222  SpO2 100 % 07/25/21 1222  Vitals shown include unvalidated device data.  Last Pain:  Vitals:   07/25/21 1014  TempSrc:   PainSc: 0-No pain      Patients Stated Pain Goal: 3 (16/10/96 0454)  Complications: No notable events documented.

## 2021-07-25 NOTE — Anesthesia Procedure Notes (Signed)
Anesthesia Procedure Image    

## 2021-07-25 NOTE — Op Note (Signed)
PATIENT ID:      [name]  MRN:     [id number] DOB/AGE:    61-Jul-1962 / 61 y.o.       OPERATIVE REPORT   DATE OF PROCEDURE:  07/25/2021      PREOPERATIVE DIAGNOSIS:   RIGHT KNEE OSTEOARTHRITIS      Estimated body mass index is 34.45 kg/m as calculated from the following:   Height as of this encounter: 5' 8.5" (1.74 m).   Weight as of this encounter: 104.3 kg.                                                       POSTOPERATIVE DIAGNOSIS:   Same                                                                  PROCEDURE:  Procedure(s): RIGHT TOTAL KNEE ARTHROPLASTY Using DepuyAttune RP implants #7R Femur, #7Tibia, 8 mm Attune RP bearing, 41 Patella    SURGEON: Kerin Salen  ASSISTANT:   Kerry Hough. [employer]   (Present and scrubbed throughout the case, critical for assistance with exposure, retraction, instrumentation, and closure.)        ANESTHESIA: Spinal, 20cc Exparel, 50cc 0.25% Marcaine EBL: 400 cc FLUID REPLACEMENT: 1500 cc crystaloid TOURNIQUET: DRAINS: None TRANEXAMIC ACID: 1gm IV, 2gm topical COMPLICATIONS:  None         INDICATIONS FOR PROCEDURE: The patient has  RIGHT KNEE OSTEOARTHRITIS, Var deformities, XR shows bone on bone arthritis, lateral subluxation of tibia. Patient has failed all conservative measures including anti-inflammatory medicines, narcotics, attempts at exercise and weight loss, cortisone injections and viscosupplementation.  Risks and benefits of surgery have been discussed, questions answered.   DESCRIPTION OF PROCEDURE: The patient identified by armband, received  IV antibiotics, in the holding area at [facility]. Patient taken to the operating room, appropriate anesthetic monitors were attached, and Spinal anesthesia was  induced. IV Tranexamic acid was given.Tourniquet applied high to the operative thigh. Lateral post and foot positioner applied to the table, the lower extremity was then prepped and draped in usual sterile fashion  from the toes to the tourniquet. Time-out procedure was performed. Kerry Hough. [facility] PAC, was present and scrubbed throughout the case, critical for assistance with, positioning, exposure, retraction, instrumentation, and closure.The skin and subcutaneous tissue along the incision was injected with 20 cc of a mixture of Exparel and Marcaine solution, using a 20-gauge by 1-1/2 inch needle. We began the operation, with the knee flexed 130 degrees, by making the anterior midline incision starting at handbreadth above the patella going over the patella 1 cm medial to and 4 cm distal to the tibial tubercle. Small bleeders in the skin and the subcutaneous tissue identified and cauterized. Transverse retinaculum was incised and reflected medially and a medial parapatellar arthrotomy was accomplished. the patella was everted and theprepatellar fat pad resected. The superficial medial collateral ligament was then elevated from anterior to posterior along the proximal flare of the tibia and anterior half of the menisci resected. The knee was hyperflexed exposing bone on bone arthritis. Peripheral  and notch osteophytes as well as the cruciate ligaments were then resected. We continued to work our way around posteriorly along the proximal tibia, and externally rotated the tibia subluxing it out from underneath the femur. A McHale PCL retractor was placed through the notch and a lateral Hohmann retractor placed, and we then entered the proximal tibia in line with the Depuy starter drill in line with the axis of the tibia followed by an intramedullary guide rod and 0-degree posterior slope cutting guide. The tibial cutting guide, 4 degree posterior sloped, was pinned into place allowing resection of 4 mm of bone medially and 10 mm of bone laterally. Satisfied with the tibial resection, we then entered the distal femur 2 mm anterior to the PCL origin with the intramedullary guide rod and applied the distal femoral cutting guide set  at 9 mm, with 5 degrees of valgus. This was pinned along the epicondylar axis. At this point, the distal femoral cut was accomplished without difficulty. We then sized for a #7R femoral component and pinned the guide in 3 degrees of external rotation. The chamfer cutting guide was pinned into place. The anterior, posterior, and chamfer cuts were accomplished without difficulty followed by the Attune RP box cutting guide and the box cut. We also removed posterior osteophytes from the posterior femoral condyles. The posterior capsule was injected with Exparel solution. The knee was brought into full extension. We checked our extension gap and fit a 8 mm bearing. Distracting in extension with a lamina spreader,  bleeders in the posterior capsule, Posterior medial and posterior lateral gutter were cauterized.  The transexamic acid-soaked sponge was then placed in the gap of the knee in extension. The knee was flexed 30. The posterior patella cut was accomplished with the 9.5 mm Attune cutting guide, sized for a 67mm dome, and the fixation pegs drilled.The knee was then once again hyperflexed exposing the proximal tibia. We sized for a # 7 tibial base plate, applied the smokestack and the conical reamer followed by the the Delta fin keel punch. We then hammered into place the Attune RP trial femoral component, drilled the lugs, inserted a  8 mm trial bearing, trial patellar button, and took the knee through range of motion from 0-130 degrees. Medial and lateral ligamentous stability was checked. No thumb pressure was required for patellar Tracking. The tourniquet was not used. All trial components were removed, mating surfaces irrigated with pulse lavage, and dried with suction and sponges. 10 cc of the Exparel solution was applied to the cancellus bone of the patella distal femur and proximal tibia.  After waiting 30 seconds, the bony surfaces were again, dried with sponges. A double batch of DePuy HV cement was mixed  and applied to all bony metallic mating surfaces except for the posterior condyles of the femur itself. In order, we hammered into place the tibial tray and removed excess cement, the femoral component and removed excess cement. The final Attune RP bearing was inserted, and the knee brought to full extension with compression. The patellar button was clamped into place, and excess cement removed. The knee was held at 30 flexion with compression, while the cement cured. The wound was irrigated out with normal saline solution pulse lavage. The rest of the Exparel was injected into the parapatellar arthrotomy, subcutaneous tissues, and periosteal tissues. The parapatellar arthrotomy was closed with running #1 Vicryl suture. The subcutaneous tissue with 3-0 undyed Vicryl suture, and the skin with running 3-0 SQ vicryl. An Aquacil and  Ace wrap were applied. The patient was taken to recovery room without difficulty.   Kerin Salen 07/25/2021, 9:02 AM

## 2021-07-25 NOTE — Anesthesia Preprocedure Evaluation (Signed)
Anesthesia Evaluation  Patient identified by MRN, date of birth, ID band Patient awake    Reviewed: Allergy & Precautions, NPO status , Patient's Chart, lab work & pertinent test results  Airway Mallampati: II  TM Distance: >3 FB Neck ROM: Full    Dental no notable dental hx.    Pulmonary neg pulmonary ROS, former smoker,    Pulmonary exam normal breath sounds clear to auscultation       Cardiovascular hypertension, Normal cardiovascular exam Rhythm:Regular Rate:Normal     Neuro/Psych negative neurological ROS  negative psych ROS   GI/Hepatic Neg liver ROS, GERD  ,  Endo/Other  negative endocrine ROS  Renal/GU negative Renal ROS  negative genitourinary   Musculoskeletal  (+) Arthritis , Osteoarthritis,    Abdominal   Peds negative pediatric ROS (+)  Hematology negative hematology ROS (+)   Anesthesia Other Findings   Reproductive/Obstetrics negative OB ROS                             Anesthesia Physical Anesthesia Plan  ASA: 2  Anesthesia Plan: Spinal   Post-op Pain Management: Regional block   Induction: Intravenous  PONV Risk Score and Plan: 3 and Ondansetron, Dexamethasone and Treatment may vary due to age or medical condition  Airway Management Planned: Simple Face Mask  Additional Equipment:   Intra-op Plan:   Post-operative Plan:   Informed Consent: I have reviewed the patients History and Physical, chart, labs and discussed the procedure including the risks, benefits and alternatives for the proposed anesthesia with the patient or authorized representative who has indicated his/her understanding and acceptance.     Dental advisory given  Plan Discussed with: CRNA and Surgeon  Anesthesia Plan Comments:         Anesthesia Quick Evaluation

## 2021-07-25 NOTE — Progress Notes (Signed)
Orthopedic Tech Progress Note Patient Details:  [name] 1960-10-07 [id number]  Ortho Devices Type of Ortho Device: Bone foam zero knee Ortho Device/Splint Interventions: Application   Post Interventions Patient Tolerated: Well Instructions Provided: Care of device  [place] Pink 07/25/2021, 1:12 PM

## 2021-07-25 NOTE — Anesthesia Procedure Notes (Signed)
Anesthesia Regional Block: Adductor canal block   Pre-Anesthetic Checklist: , timeout performed,  Correct Patient, Correct Site, Correct Laterality,  Correct Procedure, Correct Position, site marked,  Risks and benefits discussed,  Surgical consent,  Pre-op evaluation,  At surgeon's request and post-op pain management  Laterality: Right  Prep: chloraprep       Needles:  Injection technique: Single-shot  Needle Type: Echogenic Needle     Needle Length: 9cm      Additional Needles:   Procedures:,,,, ultrasound used (permanent image in chart),,    Narrative:  Start time: 07/25/2021 10:06 AM End time: 07/25/2021 10:11 AM Injection made incrementally with aspirations every 5 mL.  Performed by: Personally  Anesthesiologist: Myrtie Soman, MD  Additional Notes: Patient tolerated the procedure well without complications

## 2021-07-26 ENCOUNTER — Encounter (HOSPITAL_COMMUNITY): Payer: Self-pay | Admitting: Orthopedic Surgery

## 2021-07-26 ENCOUNTER — Other Ambulatory Visit: Payer: Self-pay

## 2021-07-26 DIAGNOSIS — M1711 Unilateral primary osteoarthritis, right knee: Secondary | ICD-10-CM | POA: Diagnosis not present

## 2021-07-26 LAB — BASIC METABOLIC PANEL
Anion gap: 7 (ref 5–15)
BUN: 7 mg/dL (ref 6–20)
CO2: 26 mmol/L (ref 22–32)
Calcium: 8.3 mg/dL — ABNORMAL LOW (ref 8.9–10.3)
Chloride: 100 mmol/L (ref 98–111)
Creatinine, Ser: 0.56 mg/dL (ref 0.44–1.00)
GFR, Estimated: >60 mL/min (ref >60)
Glucose, Bld: 139 mg/dL — ABNORMAL HIGH (ref 70–99)
Potassium: 3.9 mmol/L (ref 3.5–5.1)
Sodium: 133 mmol/L — ABNORMAL LOW (ref 135–145)

## 2021-07-26 LAB — CBC
HCT: 31.9 % — ABNORMAL LOW (ref 36.0–46.0)
Hemoglobin: 10.2 g/dL — ABNORMAL LOW (ref 12.0–15.0)
MCH: 27.9 pg (ref 26.0–34.0)
MCHC: 32 g/dL (ref 30.0–36.0)
MCV: 87.2 fL (ref 80.0–100.0)
Platelets: 213 10*3/uL (ref 150–400)
RBC: 3.66 MIL/uL — ABNORMAL LOW (ref 3.87–5.11)
RDW: 13.5 % (ref 11.5–15.5)
WBC: 8 10*3/uL (ref 4.0–10.5)
nRBC: 0 % (ref 0.0–0.2)

## 2021-07-26 MED ORDER — METHOCARBAMOL 500 MG PO TABS
500.0000 mg | ORAL_TABLET | Freq: Four times a day (QID) | ORAL | Status: DC | PRN
  Administered 2021-07-26 – 2021-07-27 (×4): 500 mg via ORAL
  Filled 2021-07-26 (×4): qty 1

## 2021-07-26 NOTE — Plan of Care (Signed)
  Problem: Activity: Goal: Ability to avoid complications of mobility impairment will improve Outcome: Progressing Goal: Range of joint motion will improve Outcome: Progressing   Problem: Pain Management: Goal: Pain level will decrease with appropriate interventions Outcome: Progressing   

## 2021-07-26 NOTE — TOC Transition Note (Signed)
Transition of Care [facility]) - CM/SW Discharge Note   Patient Details  Name: [name] MRN: [id number] Date of Birth: 01-23-1961  [facility]) CM/SW Contact:  Lennart Pall, LCSW Phone Number: 07/26/2021, 2:01 PM   Clinical Narrative:    Met with pt and confirming receipt of rolling walker via [employer].  Orders in for HHPT - no agency preference - referral placed with [facility].  No further TOC needs.   Final next level of care: [place] Barriers to Discharge: Continued Medical Work up   Patient Goals and CMS Choice Patient states their goals for this hospitalization and ongoing recovery are:: return home      Discharge Placement                       Discharge Plan and Services                DME Arranged: Walker rolling DME Agency: [employer]       HH Arranged: PT [place] Agency: [place] Date [place]: 07/26/21 Time [place]: 15 Representative spoke with at [place]: [place] ([place]) Interventions     Readmission Risk Interventions No flowsheet data found.

## 2021-07-26 NOTE — Progress Notes (Signed)
PATIENT ID: [name]  MRN: [id number]  DOB/AGE:  1960/08/17 / 61 y.o.  1 Day Post-Op Procedure(s) (LRB): RIGHT TOTAL KNEE ARTHROPLASTY (Right)    PROGRESS NOTE Subjective: Patient is alert, oriented, no Nausea, no Vomiting, yes passing gas. Taking PO well. Denies SOB, Chest or Calf Pain. Using Incentive Spirometer, PAS in place. Ambulate WBAT, Patient reports pain as 6/10 .    Objective: Vital signs in last 24 hours: Vitals:   07/25/21 2000 07/25/21 2153 07/26/21 0207 07/26/21 0639  BP: (!) 146/81 131/79 134/83 (!) 150/88  Pulse: 70 96 (!) 104 86  Resp: 16 18 18 18   Temp: 98.4 F (36.9 C) 98.7 F (37.1 C) 99.1 F (37.3 C) 99.5 F (37.5 C)  TempSrc:  Oral Oral Oral  SpO2: 100% 99% 97% 98%  Weight:      Height:          Intake/Output from previous day: I/O last 3 completed shifts: In: 3543.1 [P.O.:840; I.V.:2103.1; IV Piggyback:600] Out: 2550 [Urine:2500; Blood:50]   Intake/Output this shift: No intake/output data recorded.   LABORATORY DATA: Recent Labs    07/26/21 0315  WBC 8.0  HGB 10.2*  HCT 31.9*  PLT 213  NA 133*  K 3.9  CL 100  CO2 26  BUN 7  CREATININE 0.56  GLUCOSE 139*  CALCIUM 8.3*    Examination: Neurologically intact ABD soft Neurovascular intact Sensation intact distally Intact pulses distally Dorsiflexion/Plantar flexion intact Incision: scant drainage No cellulitis present Compartment soft}  Assessment:   1 Day Post-Op Procedure(s) (LRB): RIGHT TOTAL KNEE ARTHROPLASTY (Right) ADDITIONAL DIAGNOSIS: Expected Acute Blood Loss Anemia, morbid obesity, bariatric surgery  Patient's anticipated LOS is less than 2 midnights, meeting these requirements: - Younger than 61 y.o. - Lives within 1 hour of care - Has a competent adult at home to recover with post-op recover - NO history of  - Chronic pain requiring opiods  - Diabetes  - Coronary Artery Disease  - Heart failure  - Heart attack  - Stroke  - DVT/VTE  - Cardiac  arrhythmia  - Respiratory Failure/COPD  - Renal failure  - Anemia  - Advanced Liver disease     Plan: PT/OT WBAT, AROM and PROM  DVT Prophylaxis:  SCDx72hrs, ASA 81 mg BID x 2 weeks DISCHARGE PLAN: Home, when passes PT DISCHARGE NEEDS: HHPT, Walker, and 3-in-1 comode seat     Kerin Salen 07/26/2021, 7:41 AM  Patient ID: [name], female   DOB: 1961/04/02, 61 y.o.   MRN: [id number]

## 2021-07-26 NOTE — Progress Notes (Signed)
Physical Therapy Treatment Patient Details Name: [name] MRN: [id number] DOB: 1961-07-02 Today's Date: 07/26/2021   History of Present Illness Pt is a 61 y.o. female s/p Rt TKA on 07/25/21.  PMh significnat for DVT, GERD, HTN, vertigo, ORIF (L radial fractue), Lt TKA.    PT Comments    Pt reports increased pain however attempting to participate and ambulate.  Pt only tolerated short distance in room and then assisted back to bed.  Pt had pain meds prior to session and RN brought in muscle relaxer end of session.  Pt not yet ready for d/c home.  RN notified.     Recommendations for follow up therapy are one component of a multi-disciplinary discharge planning process, led by the attending physician.  Recommendations may be updated based on patient status, additional functional criteria and insurance authorization.  Follow Up Recommendations  Follow physician's recommendations for discharge plan and follow up therapies     Assistance Recommended at Discharge Frequent or constant Supervision/Assistance  Patient can return home with the following A little help with walking and/or transfers;A little help with bathing/dressing/bathroom;Assistance with cooking/housework;Assist for transportation;Help with stairs or ramp for entrance   Equipment Recommendations  Rolling walker (2 wheels)    Recommendations for Other Services       Precautions / Restrictions Precautions Precautions: Fall;Knee Restrictions Weight Bearing Restrictions: No RLE Weight Bearing: Weight bearing as tolerated     Mobility  Bed Mobility Overal bed mobility: Needs Assistance Bed Mobility: Sit to Supine     Supine to sit: Min assist, HOB elevated Sit to supine: Min assist, HOB elevated   General bed mobility comments: verbal cues for self assist; required assist for Rt LE due to pain    Transfers Overall transfer level: Needs assistance Equipment used: Rolling walker (2 wheels) Transfers:  Sit to/from Stand Sit to Stand: Min assist   Step pivot transfers: Min assist       General transfer comment: verbal cues for UE and LE positioning; assist for rise and positioning Rt LE due to pain    Ambulation/Gait Ambulation/Gait assistance: Min guard, Min assist Gait Distance (Feet): 15 Feet Assistive device: Rolling walker (2 wheels) Gait Pattern/deviations: Step-to pattern, Antalgic, Decreased stance time - right Gait velocity: decr     General Gait Details: verbal cues for sequence, RW positioning, step length, posture; pt reports "32" /10 pain, increased time and effort, distance limited by pain   Stairs             Wheelchair Mobility    Modified Rankin (Stroke Patients Only)       Balance                                            Cognition Arousal/Alertness: Awake/alert Behavior During Therapy: WFL for tasks assessed/performed Overall Cognitive Status: Within Functional Limits for tasks assessed                                          Exercises      General Comments        Pertinent Vitals/Pain Pain Assessment Pain Assessment: 0-10 Pain Score: 10-Worst pain ever ("32") Pain Location: Rt knee Pain Descriptors / Indicators: Discomfort, Grimacing, Sore, Stabbing Pain Intervention(s): Repositioned, Monitored during session, Premedicated before  session, Ice applied    Home Living                          Prior Function            PT Goals (current goals can now be found in the care plan section) Progress towards PT goals: Progressing toward goals    Frequency    7X/week      PT Plan Current plan remains appropriate    Co-evaluation              AM-PAC PT "6 Clicks" Mobility   Outcome Measure  Help needed turning from your back to your side while in a flat bed without using bedrails?: A Little Help needed moving from lying on your back to sitting on the side of a flat bed  without using bedrails?: A Little Help needed moving to and from a bed to a chair (including a wheelchair)?: A Little Help needed standing up from a chair using your arms (e.g., wheelchair or bedside chair)?: A Lot Help needed to walk in hospital room?: A Lot Help needed climbing 3-5 steps with a railing? : Total 6 Click Score: 14    End of Session Equipment Utilized During Treatment: Gait belt Activity Tolerance: Patient limited by pain Patient left: in bed;with call bell/phone within reach;with bed alarm set;with family/visitor present Nurse Communication: Mobility status PT Visit Diagnosis: Unsteadiness on feet (R26.81);Muscle weakness (generalized) (M62.81);Pain;Difficulty in walking, not elsewhere classified (R26.2) Pain - Right/Left: Right Pain - part of body: Knee     Time: 1431-1456 PT Time Calculation (min) (ACUTE ONLY): 25 min  Charges:  $Gait Training: 23-37 mins                     Jannette Spanner PT, DPT Acute Rehabilitation Services Pager: [phone] Office: [place] 07/26/2021, 3:10 PM

## 2021-07-26 NOTE — Progress Notes (Signed)
Physical Therapy Treatment Patient Details Name: [name] MRN: [id number] DOB: [birth year] Today's Date: 07/26/2021   History of Present Illness Pt is a 61 y.o. female s/p Rt TKA on 07/25/21.  PMh significnat for DVT, GERD, HTN, vertigo, ORIF (L radial fractue), Lt TKA.    PT Comments    Pt assisted with OOB to recliner.  Pt attempting to assist as able however limited by pain.  RN notified.    Recommendations for follow up therapy are one component of a multi-disciplinary discharge planning process, led by the attending physician.  Recommendations may be updated based on patient status, additional functional criteria and insurance authorization.  Follow Up Recommendations  Follow physician's recommendations for discharge plan and follow up therapies     Assistance Recommended at Discharge Frequent or constant Supervision/Assistance  Patient can return home with the following A little help with walking and/or transfers;A little help with bathing/dressing/bathroom;Assistance with cooking/housework;Assist for transportation;Help with stairs or ramp for entrance   Equipment Recommendations  Rolling walker (2 wheels)    Recommendations for Other Services       Precautions / Restrictions Precautions Precautions: Fall;Knee Restrictions Weight Bearing Restrictions: No RLE Weight Bearing: Weight bearing as tolerated     Mobility  Bed Mobility Overal bed mobility: Needs Assistance Bed Mobility: Supine to Sit     Supine to sit: Min assist, HOB elevated     General bed mobility comments: verbal cues for self assist; required assist for Rt LE due to pain    Transfers Overall transfer level: Needs assistance Equipment used: Rolling walker (2 wheels) Transfers: Sit to/from Stand, Bed to chair/wheelchair/BSC Sit to Stand: Min assist, From elevated surface   Step pivot transfers: Min assist       General transfer comment: required elevated surface; verbal cues for  UE and LE positioning; assist for rise and positioning Rt LE due to pain    Ambulation/Gait                   Stairs             Wheelchair Mobility    Modified Rankin (Stroke Patients Only)       Balance                                            Cognition Arousal/Alertness: Awake/alert Behavior During Therapy: WFL for tasks assessed/performed Overall Cognitive Status: Within Functional Limits for tasks assessed                                          Exercises      General Comments        Pertinent Vitals/Pain Pain Assessment Pain Assessment: 0-10 Pain Score: 10-Worst pain ever Pain Location: Rt knee Pain Descriptors / Indicators: Discomfort, Grimacing, Sore, Tender Pain Intervention(s): Repositioned, Monitored during session, Ice applied    Home Living                          Prior Function            PT Goals (current goals can now be found in the care plan section) Progress towards PT goals: Progressing toward goals    Frequency    7X/week  PT Plan Current plan remains appropriate    Co-evaluation              AM-PAC PT "6 Clicks" Mobility   Outcome Measure  Help needed turning from your back to your side while in a flat bed without using bedrails?: A Little Help needed moving from lying on your back to sitting on the side of a flat bed without using bedrails?: A Lot Help needed moving to and from a bed to a chair (including a wheelchair)?: A Lot Help needed standing up from a chair using your arms (e.g., wheelchair or bedside chair)?: A Lot Help needed to walk in hospital room?: A Lot Help needed climbing 3-5 steps with a railing? : Total 6 Click Score: 12    End of Session Equipment Utilized During Treatment: Gait belt Activity Tolerance: Patient limited by pain Patient left: in chair;with call bell/phone within reach;with chair alarm set Nurse Communication:  Mobility status PT Visit Diagnosis: Unsteadiness on feet (R26.81);Muscle weakness (generalized) (M62.81);Pain;Difficulty in walking, not elsewhere classified (R26.2) Pain - Right/Left: Right Pain - part of body: Knee     Time: 7824-2353 PT Time Calculation (min) (ACUTE ONLY): 22 min  Charges:  $Therapeutic Activity: 8-22 mins                     Jannette Spanner PT, DPT Acute Rehabilitation Services Pager: [phone] Office: [place] 07/26/2021, 3:02 PM

## 2021-07-27 ENCOUNTER — Other Ambulatory Visit: Payer: Self-pay

## 2021-07-27 DIAGNOSIS — M1711 Unilateral primary osteoarthritis, right knee: Secondary | ICD-10-CM | POA: Diagnosis not present

## 2021-07-27 LAB — CBC
HCT: 30.1 % — ABNORMAL LOW (ref 36.0–46.0)
Hemoglobin: 9.6 g/dL — ABNORMAL LOW (ref 12.0–15.0)
MCH: 27.6 pg (ref 26.0–34.0)
MCHC: 31.9 g/dL (ref 30.0–36.0)
MCV: 86.5 fL (ref 80.0–100.0)
Platelets: 191 10*3/uL (ref 150–400)
RBC: 3.48 MIL/uL — ABNORMAL LOW (ref 3.87–5.11)
RDW: 13.3 % (ref 11.5–15.5)
WBC: 9.9 10*3/uL (ref 4.0–10.5)
nRBC: 0 % (ref 0.0–0.2)

## 2021-07-27 NOTE — Plan of Care (Signed)
°  Problem: Education: Goal: Knowledge of the prescribed therapeutic regimen will improve Outcome: Progressing   Problem: Activity: Goal: Range of joint motion will improve Outcome: Progressing   Problem: Pain Management: Goal: Pain level will decrease with appropriate interventions Outcome: Progressing   

## 2021-07-27 NOTE — Progress Notes (Signed)
Discharge package given to pt and son [name]. Verbalizes understanding.

## 2021-07-27 NOTE — Progress Notes (Signed)
PATIENT ID: [name]  MRN: [id number]  DOB/AGE:  July 10, 1960 / 61 y.o.  2 Days Post-Op Procedure(s) (LRB): RIGHT TOTAL KNEE ARTHROPLASTY (Right)    PROGRESS NOTE Subjective: Patient is alert, oriented, no Nausea, no Vomiting, yes passing gas. Taking PO well. Denies SOB, Chest or Calf Pain. Using Incentive Spirometer, PAS in place. Ambulate WBAT with pt walking 15 ft with therapy, Patient reports pain as 8/10.    Objective: Vital signs in last 24 hours: Vitals:   07/26/21 1728 07/26/21 2120 07/27/21 0140 07/27/21 0506  BP: (!) 155/86 (!) 146/87 123/82 133/81  Pulse: 88 94 (!) 104 (!) 104  Resp: 18 19 18 18   Temp: 99 F (37.2 C) 99.1 F (37.3 C) 99.4 F (37.4 C) 98.5 F (36.9 C)  TempSrc: Oral Oral  Oral  SpO2: 100% 99% 100% 100%  Weight:      Height:          Intake/Output from previous day: I/O last 3 completed shifts: In: 1543.1 [P.O.:840; I.V.:703.1] Out: 5600 [Urine:5600]   Intake/Output this shift: No intake/output data recorded.   LABORATORY DATA: Recent Labs    07/26/21 0315 07/27/21 0315  WBC 8.0 9.9  HGB 10.2* 9.6*  HCT 31.9* 30.1*  PLT 213 191  NA 133*  --   K 3.9  --   CL 100  --   CO2 26  --   BUN 7  --   CREATININE 0.56  --   GLUCOSE 139*  --   CALCIUM 8.3*  --     Examination: Neurologically intact Neurovascular intact Sensation intact distally Intact pulses distally Dorsiflexion/Plantar flexion intact Incision: dressing C/D/I and no drainage No cellulitis present Compartment soft}  Assessment:   2 Days Post-Op Procedure(s) (LRB): RIGHT TOTAL KNEE ARTHROPLASTY (Right) ADDITIONAL DIAGNOSIS: Expected Acute Blood Loss Anemia, morbid obesity, bariatric surgery  Anticipated LOS equal to or greater than 2 midnights due to - Age 91 and older with one or more of the following:  - Obesity  - Expected need for hospital services (PT, OT, Nursing) required for safe  discharge  - Anticipated need for postoperative skilled nursing care  or inpatient rehab    OR   - Unanticipated findings during/Post Surgery: Slow post-op progression: GI, pain control, mobility     Plan: PT/OT WBAT, AROM and PROM  DVT Prophylaxis:  SCDx72hrs, ASA 81 mg BID x 2 weeks DISCHARGE PLAN: Home, when passes PT DISCHARGE NEEDS: HHPT, Walker, and 3-in-1 comode seat     [name] 07/27/2021, 9:45 AM

## 2021-07-27 NOTE — Progress Notes (Signed)
Physical Therapy Treatment Patient Details Name: [name] MRN: [id number] DOB: 1960/07/27 Today's Date: 07/27/2021   History of Present Illness Pt is a 61 y.o. female s/p Rt TKA on 07/25/21.  PMh significnat for DVT, GERD, HTN, vertigo, ORIF (L radial fractue), Lt TKA.    PT Comments    Pt reports pain improved this afternoon and tolerated mobility much better.  Pt ambulated in hallway and practiced safe stair technique with son.  Pt feels she can d/c home as son reports he will assist her for a couple days.   Pt plans to f/u with HHPT.    Recommendations for follow up therapy are one component of a multi-disciplinary discharge planning process, led by the attending physician.  Recommendations may be updated based on patient status, additional functional criteria and insurance authorization.  Follow Up Recommendations  Follow physician's recommendations for discharge plan and follow up therapies     Assistance Recommended at Discharge Frequent or constant Supervision/Assistance  Patient can return home with the following A little help with walking and/or transfers;A little help with bathing/dressing/bathroom;Assistance with cooking/housework;Help with stairs or ramp for entrance   Equipment Recommendations  Rolling walker (2 wheels)    Recommendations for Other Services       Precautions / Restrictions Precautions Precautions: Fall;Knee Restrictions Weight Bearing Restrictions: No RLE Weight Bearing: Weight bearing as tolerated     Mobility  Bed Mobility Overal bed mobility: Needs Assistance Bed Mobility: Sit to Supine       Sit to supine: Mod assist   General bed mobility comments: pt in recliner    Transfers Overall transfer level: Needs assistance Equipment used: Rolling walker (2 wheels) Transfers: Sit to/from Stand Sit to Stand: Min guard   Step pivot transfers: Min assist       General transfer comment: verbal cues for UE and LE positioning;  pt educated on self assisting/positioning Rt LE with gait belt; son also present and observed    Ambulation/Gait Ambulation/Gait assistance: Min guard Gait Distance (Feet): 60 Feet Assistive device: Rolling walker (2 wheels) Gait Pattern/deviations: Step-to pattern, Antalgic, Decreased stance time - right Gait velocity: decr     General Gait Details: verbal cues for sequence, RW positioning, step length, posture; pt requiring frequent standing rest breaks due to UE fatigue   Stairs Stairs: Yes Stairs assistance: Min guard Stair Management: Forwards, One rail Left, With cane Number of Stairs: 3 General stair comments: verbal cues for sequence and safety; son present and held gait belt; pt performed twice   Wheelchair Mobility    Modified Rankin (Stroke Patients Only)       Balance                                            Cognition Arousal/Alertness: Awake/alert Behavior During Therapy: WFL for tasks assessed/performed Overall Cognitive Status: Within Functional Limits for tasks assessed                                          Exercises      General Comments        Pertinent Vitals/Pain Pain Assessment Pain Assessment: 0-10 Pain Score: 6  Pain Location: Rt knee Pain Descriptors / Indicators: Discomfort, Grimacing, Sore, Stabbing Pain Intervention(s): Repositioned, Premedicated before session, Monitored  during session    Home Living                          Prior Function            PT Goals (current goals can now be found in the care plan section) Progress towards PT goals: Progressing toward goals    Frequency    7X/week      PT Plan Current plan remains appropriate    Co-evaluation              AM-PAC PT "6 Clicks" Mobility   Outcome Measure  Help needed turning from your back to your side while in a flat bed without using bedrails?: A Little Help needed moving from lying on your  back to sitting on the side of a flat bed without using bedrails?: A Little Help needed moving to and from a bed to a chair (including a wheelchair)?: A Little Help needed standing up from a chair using your arms (e.g., wheelchair or bedside chair)?: A Little Help needed to walk in hospital room?: A Little Help needed climbing 3-5 steps with a railing? : A Little 6 Click Score: 18    End of Session Equipment Utilized During Treatment: Gait belt Activity Tolerance: Patient tolerated treatment well Patient left: in chair;with call bell/phone within reach;with family/visitor present Nurse Communication: Mobility status PT Visit Diagnosis: Muscle weakness (generalized) (M62.81);Difficulty in walking, not elsewhere classified (R26.2) Pain - Right/Left: Right Pain - part of body: Knee     Time: 7290-2111 PT Time Calculation (min) (ACUTE ONLY): 33 min  Charges:  $Gait Training: 23-37 mins                    Jannette Spanner PT, DPT Acute Rehabilitation Services Pager: [phone] Office: [place] 07/27/2021, 4:23 PM

## 2021-07-27 NOTE — Progress Notes (Signed)
Physical Therapy Treatment Patient Details Name: [name] MRN: [id number] DOB: 1961-04-28 Today's Date: 07/27/2021   History of Present Illness Pt is a 61 y.o. female s/p Rt TKA on 07/25/21.  PMh significnat for DVT, GERD, HTN, vertigo, ORIF (L radial fractue), Lt TKA.    PT Comments    Pt assisted with ambulating in hallway and returned back to bed.  Pt with increased pain this morning and requiring min-mod assist for transfers and bed mobility.  RN notified of pain.    Recommendations for follow up therapy are one component of a multi-disciplinary discharge planning process, led by the attending physician.  Recommendations may be updated based on patient status, additional functional criteria and insurance authorization.  Follow Up Recommendations  Follow physician's recommendations for discharge plan and follow up therapies     Assistance Recommended at Discharge Frequent or constant Supervision/Assistance  Patient can return home with the following A little help with walking and/or transfers;A little help with bathing/dressing/bathroom;Assistance with cooking/housework;Help with stairs or ramp for entrance   Equipment Recommendations  Rolling walker (2 wheels)    Recommendations for Other Services       Precautions / Restrictions Precautions Precautions: Fall;Knee Restrictions Weight Bearing Restrictions: No RLE Weight Bearing: Weight bearing as tolerated     Mobility  Bed Mobility Overal bed mobility: Needs Assistance Bed Mobility: Sit to Supine       Sit to supine: Mod assist   General bed mobility comments: verbal cues for self assist; pt required assist for R LE and son assisted with scooting pt back onto bed    Transfers Overall transfer level: Needs assistance Equipment used: Rolling walker (2 wheels) Transfers: Sit to/from Stand Sit to Stand: Min assist   Step pivot transfers: Min assist       General transfer comment: verbal cues for UE  and LE positioning; assist for rise and positioning Rt LE due to pain    Ambulation/Gait Ambulation/Gait assistance: Min guard Gait Distance (Feet): 50 Feet Assistive device: Rolling walker (2 wheels) Gait Pattern/deviations: Step-to pattern, Antalgic, Decreased stance time - right Gait velocity: decr     General Gait Details: verbal cues for sequence, RW positioning, step length, posture; pt requiring frequent standing rest breaks due to UE fatigue   Stairs             Wheelchair Mobility    Modified Rankin (Stroke Patients Only)       Balance                                            Cognition Arousal/Alertness: Awake/alert Behavior During Therapy: WFL for tasks assessed/performed Overall Cognitive Status: Within Functional Limits for tasks assessed                                          Exercises      General Comments        Pertinent Vitals/Pain Pain Assessment Pain Assessment: 0-10 Pain Score: 8  Pain Location: Rt knee Pain Descriptors / Indicators: Discomfort, Grimacing, Sore, Stabbing Pain Intervention(s): Repositioned, Monitored during session, Ice applied    Home Living  Prior Function            PT Goals (current goals can now be found in the care plan section) Progress towards PT goals: Progressing toward goals    Frequency    7X/week      PT Plan Current plan remains appropriate    Co-evaluation              AM-PAC PT "6 Clicks" Mobility   Outcome Measure  Help needed turning from your back to your side while in a flat bed without using bedrails?: A Little Help needed moving from lying on your back to sitting on the side of a flat bed without using bedrails?: A Lot Help needed moving to and from a bed to a chair (including a wheelchair)?: A Lot Help needed standing up from a chair using your arms (e.g., wheelchair or bedside chair)?: A  Little Help needed to walk in hospital room?: A Little Help needed climbing 3-5 steps with a railing? : A Lot 6 Click Score: 15    End of Session Equipment Utilized During Treatment: Gait belt Activity Tolerance: Patient limited by pain Patient left: in bed;with call bell/phone within reach;with bed alarm set;with family/visitor present Nurse Communication: Mobility status PT Visit Diagnosis: Unsteadiness on feet (R26.81);Muscle weakness (generalized) (M62.81);Pain;Difficulty in walking, not elsewhere classified (R26.2) Pain - Right/Left: Right Pain - part of body: Knee     Time: 2505-3976 PT Time Calculation (min) (ACUTE ONLY): 26 min  Charges:  $Gait Training: 23-37 mins                    Jannette Spanner PT, DPT Acute Rehabilitation Services Pager: [phone] Office: [place] 07/27/2021, 4:16 PM

## 2021-07-27 NOTE — Plan of Care (Signed)
  Problem: Activity: Goal: Ability to avoid complications of mobility impairment will improve Outcome: Progressing Goal: Range of joint motion will improve Outcome: Progressing   Problem: Pain Management: Goal: Pain level will decrease with appropriate interventions Outcome: Progressing   

## 2021-07-27 NOTE — Progress Notes (Signed)
Called office and notified pt requests for Zofran as dc med.

## 2021-07-27 NOTE — Discharge Summary (Signed)
Patient ID: [name] MRN: [id number] DOB/AGE: April 27, 1961 61 y.o.  Admit date: 07/25/2021 Discharge date: 07/27/2021  Admission Diagnoses:  Principal Problem:   Osteoarthritis of right knee Active Problems:   S/P total knee arthroplasty, right   Discharge Diagnoses:  Same  Past Medical History:  Diagnosis Date   Anorectal pain    Arthritis of left knee    Bulging lumbar disc    Bunion, left    Cyst of right kidney    DJD (degenerative joint disease)    DVT (deep venous thrombosis) ([place])    After Left knee surgery 2012   Edema    Fibroid    GERD (gastroesophageal reflux disease)    H/O endoscopy    H/O gastric sleeve    History of colon surgery    History of fasciotomy    left mid arch   History of hysterectomy 2005   History of left knee replacement    History of lumpectomy    History of revision of total replacement of left knee joint    Hypertension    borderline no meds   Lipedema    Metal plates in left arm    Morbid obesity (HCC)    Pinched nerve    Plantar fasciitis, left    Sleep apnea    very mild no cpap at this time   Vertigo     Surgeries: Procedure(s): RIGHT TOTAL KNEE ARTHROPLASTY on 07/25/2021   Consultants:   Discharged Condition: Improved  Hospital Course: [name] is an 61 y.o. female who was admitted 07/25/2021 for operative treatment ofOsteoarthritis of right knee. Patient has severe unremitting pain that affects sleep, daily activities, and work/hobbies. After pre-op clearance the patient was taken to the operating room on 07/25/2021 and underwent  Procedure(s): RIGHT TOTAL KNEE ARTHROPLASTY.    Patient was given perioperative antibiotics:  Anti-infectives (From admission, onward)    Start     Dose/Rate Route Frequency Ordered Stop   07/25/21 0830  ceFAZolin (ANCEF) IVPB 2g/100 mL premix        2 g 200 mL/hr over 30 Minutes Intravenous On call to O.R. 07/25/21 2836 07/25/21 1026        Patient was given  sequential compression devices, early ambulation, and chemoprophylaxis to prevent DVT.  Patient benefited maximally from hospital stay and there were no complications.    Recent vital signs: Patient Vitals for the past 24 hrs:  BP Temp Temp src Pulse Resp SpO2  07/27/21 0506 133/81 98.5 F (36.9 C) Oral (!) 104 18 100 %  07/27/21 0140 123/82 99.4 F (37.4 C) -- (!) 104 18 100 %  07/26/21 2120 (!) 146/87 99.1 F (37.3 C) Oral 94 19 99 %  07/26/21 1728 (!) 155/86 99 F (37.2 C) Oral 88 18 100 %  07/26/21 1322 (!) 151/88 -- -- 85 18 100 %  07/26/21 1052 (!) 148/88 -- -- 83 16 100 %     Recent laboratory studies:  Recent Labs    07/26/21 0315 07/27/21 0315  WBC 8.0 9.9  HGB 10.2* 9.6*  HCT 31.9* 30.1*  PLT 213 191  NA 133*  --   K 3.9  --   CL 100  --   CO2 26  --   BUN 7  --   CREATININE 0.56  --   GLUCOSE 139*  --   CALCIUM 8.3*  --      Discharge Medications:   Allergies as of 07/27/2021  Reactions   Codeine Nausea And Vomiting        Medication List     TAKE these medications    aspirin EC 325 MG tablet Take 1 tablet (325 mg total) by mouth 2 (two) times daily.   furosemide 20 MG tablet Commonly known as: LASIX Take 20 mg by mouth daily as needed for edema.   levocetirizine 5 MG tablet Commonly known as: XYZAL Take 5 mg by mouth daily as needed for allergies.   meclizine 12.5 MG tablet Commonly known as: ANTIVERT Take 12.5-25 mg by mouth 2 (two) times daily as needed for dizziness.   MULTIVITAMIN PO Take 1 tablet by mouth daily.   omeprazole 40 MG capsule Commonly known as: PRILOSEC Take 40 mg by mouth 2 (two) times daily.   oxyCODONE 5 MG immediate release tablet Commonly known as: Roxicodone Take 1-2 tablets (5-10 mg total) by mouth every 4 (four) hours as needed for up to 7 days for severe pain.   tizanidine 2 MG capsule Commonly known as: Zanaflex Take 2 capsules (4 mg total) by mouth 3 (three) times daily as needed for muscle  spasms.   Vitamin D 125 MCG (5000 UT) Caps Take 5,000 Units/day by mouth daily.               Durable Medical Equipment  (From admission, onward)           Start     Ordered   07/25/21 1951  DME Walker rolling  Once       Question:  Patient needs a walker to treat with the following condition  Answer:  Status post right knee replacement   07/25/21 1950   07/25/21 1951  DME 3 n 1  Once        07/25/21 1950              Discharge Care Instructions  (From admission, onward)           Start     Ordered   07/27/21 0000  Weight bearing as tolerated        07/27/21 0948   07/25/21 0000  Change dressing       Comments: Change dressing Only if drainage exceeds 40% of window on dressing   07/25/21 1149            Diagnostic Studies: DG Chest 2 View  Result Date: 07/18/2021 CLINICAL DATA:  Preop for knee replacement EXAM: CHEST - 2 VIEW COMPARISON:  08/08/2013 FINDINGS: Hyperinflation. Midline trachea. Normal heart size. tiny hiatal hernia. No pleural effusion or pneumothorax. Clear lungs. Surgical clips in the upper abdomen. IMPRESSION: Hyperinflation, without acute disease. Tiny hiatal hernia, as on 05/31/2017 CT. Electronically Signed   By: Abigail Miyamoto M.D.   On: 07/18/2021 11:02    Disposition: Discharge disposition: 01-Home or Self Care       Discharge Instructions     Call MD / Call 911   Complete by: As directed    If you experience chest pain or shortness of breath, CALL 911 and be transported to the hospital emergency room.  If you develope a fever above 101 F, pus (white drainage) or increased drainage or redness at the wound, or calf pain, call your surgeon's office.   Call MD / Call 911   Complete by: As directed    If you experience chest pain or shortness of breath, CALL 911 and be transported to the hospital emergency room.  If you develope a fever  above 101 F, pus (white drainage) or increased drainage or redness at the wound, or calf  pain, call your surgeon's office.   Change dressing   Complete by: As directed    Change dressing Only if drainage exceeds 40% of window on dressing   Constipation Prevention   Complete by: As directed    Drink plenty of fluids.  Prune juice may be helpful.  You may use a stool softener, such as Colace (over the counter) 100 mg twice a day.  Use MiraLax (over the counter) for constipation as needed.   Constipation Prevention   Complete by: As directed    Drink plenty of fluids.  Prune juice may be helpful.  You may use a stool softener, such as Colace (over the counter) 100 mg twice a day.  Use MiraLax (over the counter) for constipation as needed.   Diet - low sodium heart healthy   Complete by: As directed    Driving restrictions   Complete by: As directed    No driving for 2 weeks   Increase activity slowly as tolerated   Complete by: As directed    Increase activity slowly as tolerated   Complete by: As directed    Patient may shower   Complete by: As directed    You may shower without a dressing once there is no drainage.  Do not wash over the wound.  If drainage remains, cover wound with plastic wrap and then shower.   Post-operative opioid taper instructions:   Complete by: As directed    POST-OPERATIVE OPIOID TAPER INSTRUCTIONS: It is important to wean off of your opioid medication as soon as possible. If you do not need pain medication after your surgery it is ok to stop day one. Opioids include: Codeine, Hydrocodone(Norco, Vicodin), Oxycodone(Percocet, oxycontin) and hydromorphone amongst others.  Long term and even short term use of opiods can cause: Increased pain response Dependence Constipation Depression Respiratory depression And more.  Withdrawal symptoms can include Flu like symptoms Nausea, vomiting And more Techniques to manage these symptoms Hydrate well Eat regular healthy meals Stay active Use relaxation techniques(deep breathing, meditating,  yoga) Do Not substitute Alcohol to help with tapering If you have been on opioids for less than two weeks and do not have pain than it is ok to stop all together.  Plan to wean off of opioids This plan should start within one week post op of your joint replacement. Maintain the same interval or time between taking each dose and first decrease the dose.  Cut the total daily intake of opioids by one tablet each day Next start to increase the time between doses. The last dose that should be eliminated is the evening dose.      Post-operative opioid taper instructions:   Complete by: As directed    POST-OPERATIVE OPIOID TAPER INSTRUCTIONS: It is important to wean off of your opioid medication as soon as possible. If you do not need pain medication after your surgery it is ok to stop day one. Opioids include: Codeine, Hydrocodone(Norco, Vicodin), Oxycodone(Percocet, oxycontin) and hydromorphone amongst others.  Long term and even short term use of opiods can cause: Increased pain response Dependence Constipation Depression Respiratory depression And more.  Withdrawal symptoms can include Flu like symptoms Nausea, vomiting And more Techniques to manage these symptoms Hydrate well Eat regular healthy meals Stay active Use relaxation techniques(deep breathing, meditating, yoga) Do Not substitute Alcohol to help with tapering If you have been on opioids for less  than two weeks and do not have pain than it is ok to stop all together.  Plan to wean off of opioids This plan should start within one week post op of your joint replacement. Maintain the same interval or time between taking each dose and first decrease the dose.  Cut the total daily intake of opioids by one tablet each day Next start to increase the time between doses. The last dose that should be eliminated is the evening dose.      Weight bearing as tolerated   Complete by: As directed         Follow-up Information      Frederik Pear, MD Follow up in 2 week(s).   Specialty: Orthopedic Surgery Contact information: [place] [address] [phone]         Care, [facility] Follow up.   Specialty: [place] Why: to provide home health physical therapy Contact information: [address] [place] [address] [phone]                  Signed: [name] 07/27/2021, 9:49 AM

## 2021-08-03 ENCOUNTER — Other Ambulatory Visit: Payer: Self-pay

## 2021-08-03 ENCOUNTER — Other Ambulatory Visit (HOSPITAL_COMMUNITY): Payer: Self-pay | Admitting: Orthopedic Surgery

## 2021-08-03 ENCOUNTER — Ambulatory Visit (HOSPITAL_COMMUNITY)
Admission: RE | Admit: 2021-08-03 | Discharge: 2021-08-03 | Disposition: A | Discharge status: Home / Self Care | Payer: Commercial Managed Care - HMO | Source: Ambulatory Visit | Attending: Cardiovascular Disease | Admitting: Cardiovascular Disease

## 2021-08-03 DIAGNOSIS — M79661 Pain in right lower leg: Secondary | ICD-10-CM

## 2021-08-12 ENCOUNTER — Other Ambulatory Visit: Payer: Self-pay | Admitting: Family Medicine

## 2021-08-12 DIAGNOSIS — E2839 Other primary ovarian failure: Secondary | ICD-10-CM

## 2021-08-17 ENCOUNTER — Other Ambulatory Visit: Payer: Self-pay

## 2021-08-17 ENCOUNTER — Other Ambulatory Visit: Payer: Managed Care, Other (non HMO) | Admitting: *Deleted

## 2021-08-17 ENCOUNTER — Other Ambulatory Visit: Payer: Self-pay | Admitting: Orthopedic Surgery

## 2021-08-17 DIAGNOSIS — E559 Vitamin D deficiency, unspecified: Secondary | ICD-10-CM

## 2021-08-17 DIAGNOSIS — Z79899 Other long term (current) drug therapy: Secondary | ICD-10-CM

## 2021-08-18 LAB — VITAMIN D 25 HYDROXY (VIT D DEFICIENCY, FRACTURES): Vit D, 25-Hydroxy: 43.1 ng/mL (ref 30.0–100.0)

## 2021-08-31 ENCOUNTER — Inpatient Hospital Stay (HOSPITAL_COMMUNITY): Admission: RE | Admit: 2021-08-31 | Payer: Managed Care, Other (non HMO) | Source: Ambulatory Visit

## 2021-09-07 ENCOUNTER — Ambulatory Visit: Admit: 2021-09-07 | Payer: Managed Care, Other (non HMO) | Admitting: Orthopedic Surgery

## 2021-09-07 SURGERY — KYPHOPLASTY
Anesthesia: General

## 2021-10-20 NOTE — Telephone Encounter (Signed)
Patients insurance verified with patient. ? ?Upon patient request DME selection is [facility] ?Patient understands he will be contacted by [facility] to set up his cpap. ?Patient understands to call if [facility] does not contact him with new setup in a timely manner. ?Patient understands they will be called once confirmation has been received from [facility] that they have received their new machine to schedule 10 week follow up appointment. ?  ?[facility] notified of new cpap order  ?Please add to airview ?Patient was grateful for the call and thanked me   ?

## 2021-11-07 ENCOUNTER — Other Ambulatory Visit: Payer: Self-pay | Admitting: Family Medicine

## 2021-11-07 DIAGNOSIS — Z1231 Encounter for screening mammogram for malignant neoplasm of breast: Secondary | ICD-10-CM

## 2021-12-05 ENCOUNTER — Ambulatory Visit
Admission: RE | Admit: 2021-12-05 | Discharge: 2021-12-05 | Disposition: A | Discharge status: Home / Self Care | Payer: Commercial Managed Care - HMO | Source: Ambulatory Visit | Attending: Family Medicine | Admitting: Family Medicine

## 2021-12-05 DIAGNOSIS — Z1231 Encounter for screening mammogram for malignant neoplasm of breast: Secondary | ICD-10-CM

## 2022-02-01 ENCOUNTER — Ambulatory Visit
Admission: RE | Admit: 2022-02-01 | Discharge: 2022-02-01 | Disposition: A | Discharge status: Home / Self Care | Payer: Commercial Managed Care - HMO | Source: Ambulatory Visit | Attending: Family Medicine | Admitting: Family Medicine

## 2022-02-01 DIAGNOSIS — E2839 Other primary ovarian failure: Secondary | ICD-10-CM

## 2022-02-13 ENCOUNTER — Other Ambulatory Visit: Payer: Self-pay | Admitting: Surgery

## 2022-02-21 ENCOUNTER — Other Ambulatory Visit: Payer: Self-pay | Admitting: Surgery

## 2022-02-21 DIAGNOSIS — E669 Obesity, unspecified: Secondary | ICD-10-CM

## 2022-02-21 DIAGNOSIS — Z9884 Bariatric surgery status: Secondary | ICD-10-CM

## 2022-02-21 DIAGNOSIS — R1319 Other dysphagia: Secondary | ICD-10-CM

## 2022-02-21 DIAGNOSIS — K219 Gastro-esophageal reflux disease without esophagitis: Secondary | ICD-10-CM

## 2022-03-03 ENCOUNTER — Other Ambulatory Visit: Payer: Self-pay | Admitting: Gastroenterology

## 2022-03-10 ENCOUNTER — Ambulatory Visit
Admission: RE | Admit: 2022-03-10 | Discharge: 2022-03-10 | Disposition: A | Discharge status: Home / Self Care | Payer: Commercial Managed Care - HMO | Source: Ambulatory Visit | Attending: Surgery | Admitting: Surgery

## 2022-03-10 DIAGNOSIS — K219 Gastro-esophageal reflux disease without esophagitis: Secondary | ICD-10-CM

## 2022-03-10 DIAGNOSIS — E669 Obesity, unspecified: Secondary | ICD-10-CM

## 2022-03-10 DIAGNOSIS — R1319 Other dysphagia: Secondary | ICD-10-CM

## 2022-03-10 DIAGNOSIS — Z9884 Bariatric surgery status: Secondary | ICD-10-CM

## 2022-04-05 ENCOUNTER — Ambulatory Visit (HOSPITAL_COMMUNITY)
Admission: RE | Admit: 2022-04-05 | Discharge: 2022-04-05 | Disposition: A | Discharge status: Home / Self Care | Payer: Commercial Managed Care - HMO | Attending: Gastroenterology | Admitting: Gastroenterology

## 2022-04-05 ENCOUNTER — Encounter (HOSPITAL_COMMUNITY)
Admission: RE | Disposition: A | Discharge status: Home / Self Care | Payer: Self-pay | Source: Home / Self Care | Attending: Gastroenterology

## 2022-04-05 ENCOUNTER — Encounter (HOSPITAL_COMMUNITY): Payer: Self-pay | Admitting: Gastroenterology

## 2022-04-05 DIAGNOSIS — R1319 Other dysphagia: Secondary | ICD-10-CM | POA: Insufficient documentation

## 2022-04-05 DIAGNOSIS — Z9884 Bariatric surgery status: Secondary | ICD-10-CM | POA: Diagnosis not present

## 2022-04-05 DIAGNOSIS — E669 Obesity, unspecified: Secondary | ICD-10-CM | POA: Insufficient documentation

## 2022-04-05 DIAGNOSIS — Z6830 Body mass index (BMI) 30.0-30.9, adult: Secondary | ICD-10-CM | POA: Diagnosis not present

## 2022-04-05 HISTORY — PX: ESOPHAGEAL MANOMETRY: SHX5429

## 2022-04-05 SURGERY — MANOMETRY, ESOPHAGUS

## 2022-04-05 MED ORDER — LIDOCAINE VISCOUS HCL 2 % MT SOLN
OROMUCOSAL | Status: AC
  Filled 2022-04-05: qty 15

## 2022-04-05 SURGICAL SUPPLY — 2 items
FACESHIELD LNG OPTICON STERILE (SAFETY) IMPLANT
GLOVE BIO SURGEON STRL SZ8 (GLOVE) ×2 IMPLANT

## 2022-04-05 NOTE — Progress Notes (Signed)
Esophageal manometry performed per protocol.  Patient tolerated well.  Report to be sent to Dr. Wilford Corner.

## 2022-04-10 ENCOUNTER — Encounter (HOSPITAL_COMMUNITY): Payer: Self-pay | Admitting: Gastroenterology

## 2022-05-31 ENCOUNTER — Other Ambulatory Visit: Payer: Self-pay | Admitting: Gastroenterology

## 2022-06-28 ENCOUNTER — Encounter (HOSPITAL_COMMUNITY)
Admission: RE | Disposition: A | Discharge status: Home / Self Care | Payer: Self-pay | Source: Home / Self Care | Attending: Gastroenterology

## 2022-06-28 ENCOUNTER — Ambulatory Visit (HOSPITAL_COMMUNITY)
Admission: RE | Admit: 2022-06-28 | Discharge: 2022-06-28 | Disposition: A | Discharge status: Home / Self Care | Payer: Commercial Managed Care - HMO | Attending: Gastroenterology | Admitting: Gastroenterology

## 2022-06-28 DIAGNOSIS — Z5320 Procedure and treatment not carried out because of patient's decision for unspecified reasons: Secondary | ICD-10-CM | POA: Insufficient documentation

## 2022-06-28 DIAGNOSIS — K469 Unspecified abdominal hernia without obstruction or gangrene: Secondary | ICD-10-CM | POA: Diagnosis not present

## 2022-06-28 DIAGNOSIS — R1310 Dysphagia, unspecified: Secondary | ICD-10-CM | POA: Insufficient documentation

## 2022-06-28 DIAGNOSIS — Z5329 Procedure and treatment not carried out because of patient's decision for other reasons: Secondary | ICD-10-CM | POA: Diagnosis not present

## 2022-06-28 SURGERY — INVASIVE LAB ABORTED CASE

## 2022-06-28 MED ORDER — LIDOCAINE VISCOUS HCL 2 % MT SOLN
OROMUCOSAL | Status: AC
  Filled 2022-06-28: qty 15

## 2022-06-28 NOTE — Progress Notes (Signed)
Patient here for [facility] testing. Had mano in October with normal results. Placed PH probe per protocol with no difficulty.  After placed patient stated that she could not tolerate the probe inserted for 24 hours and wished it taken out. Paitent had no coughing or gagging. Probe removed without difficulty. Will make Dr. Therisa Doyne aware of cancelled Ph testing per patient not able to wear probe for 24 hours.

## 2022-12-18 IMAGING — MG MM DIGITAL SCREENING BILAT W/ TOMO AND CAD
6 of 12 series · 6 of 36 positions shown · non-contrast
Comparison: Previous exam(s).

CLINICAL DATA: Screening.

EXAM:
DIGITAL SCREENING BILATERAL MAMMOGRAM WITH TOMOSYNTHESIS AND CAD
TECHNIQUE: Bilateral screening digital craniocaudal and mediolateral oblique
mammograms were obtained. Bilateral screening digital breast
tomosynthesis was performed. The images were evaluated with
computer-aided detection.

[L MLO synth-2D (1 of 2)]
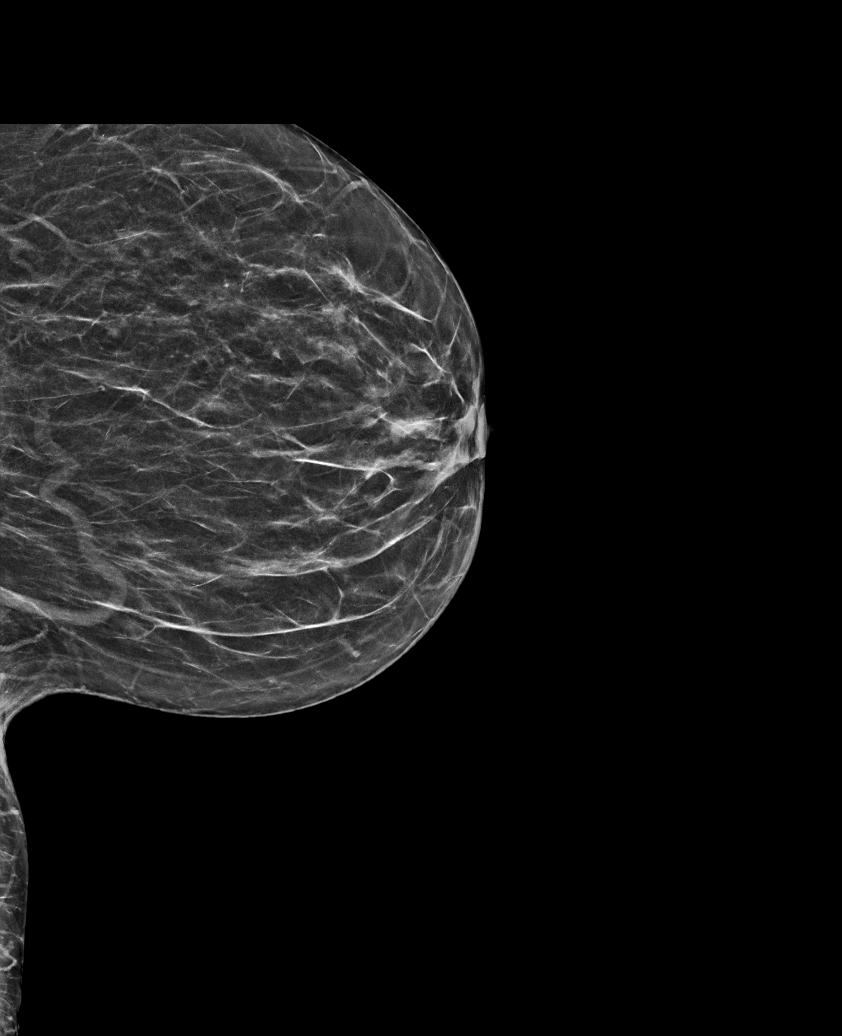

[R MLO synth-2D (1 of 2)]
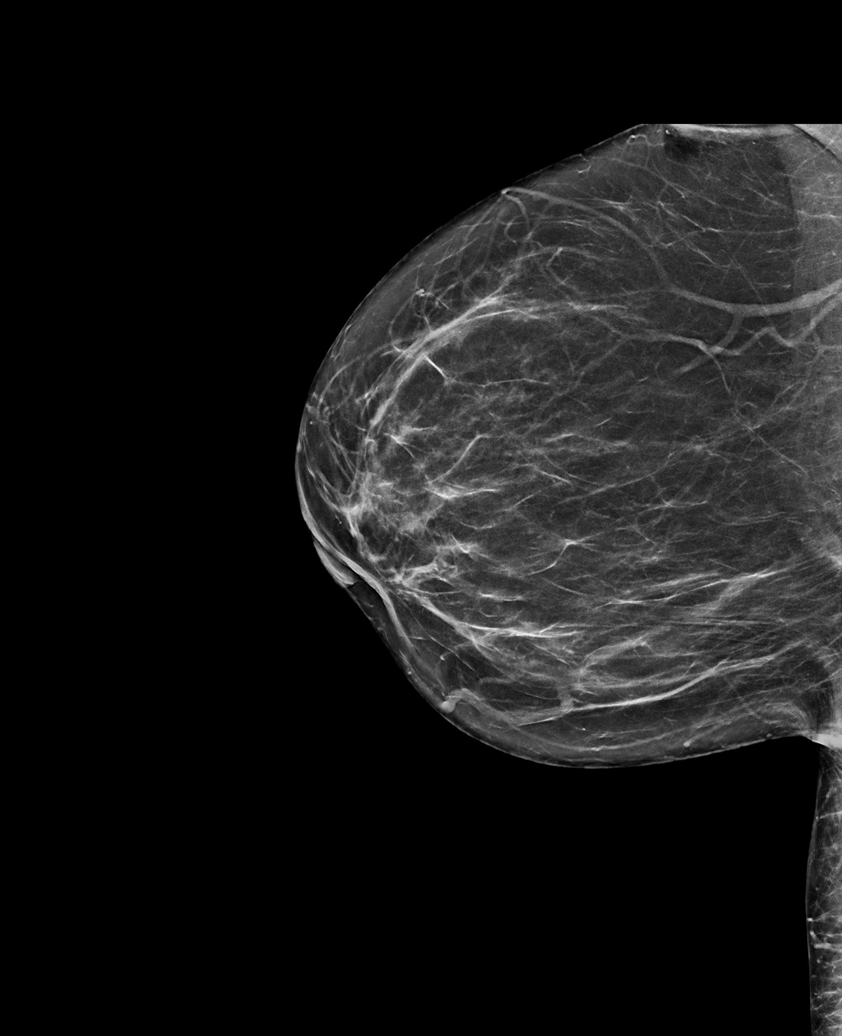

[L CC synth-2D]
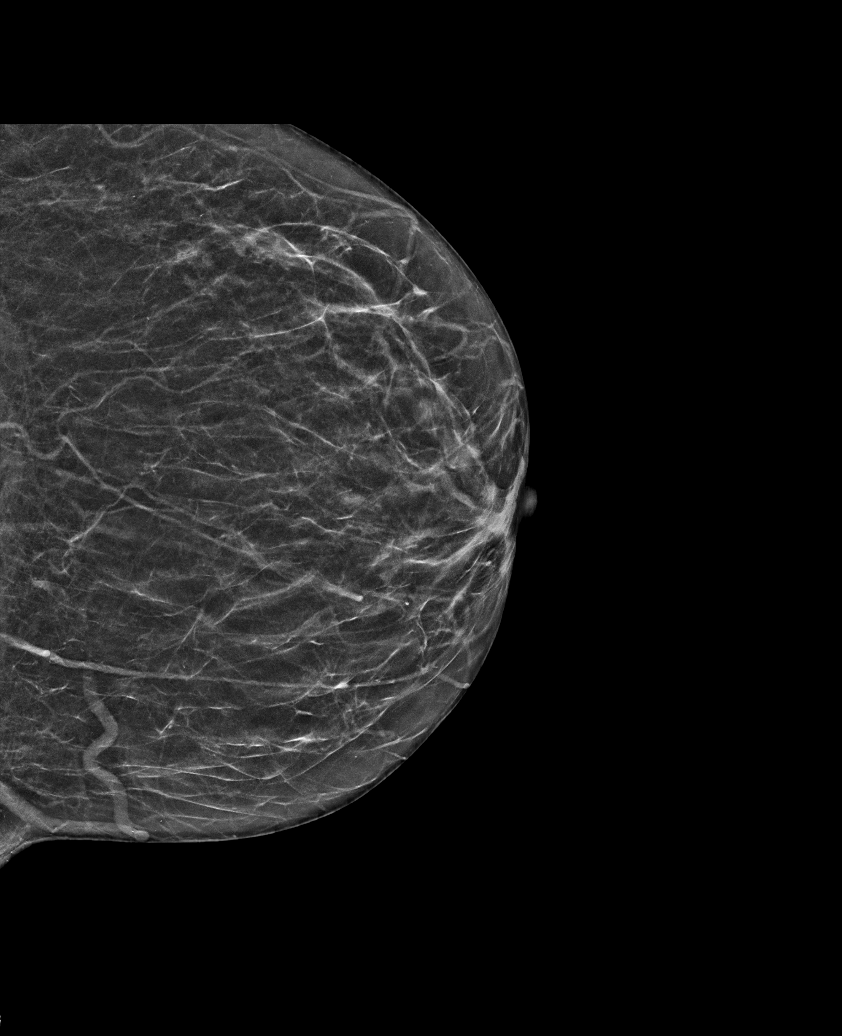

[R MLO synth-2D (2 of 2)]
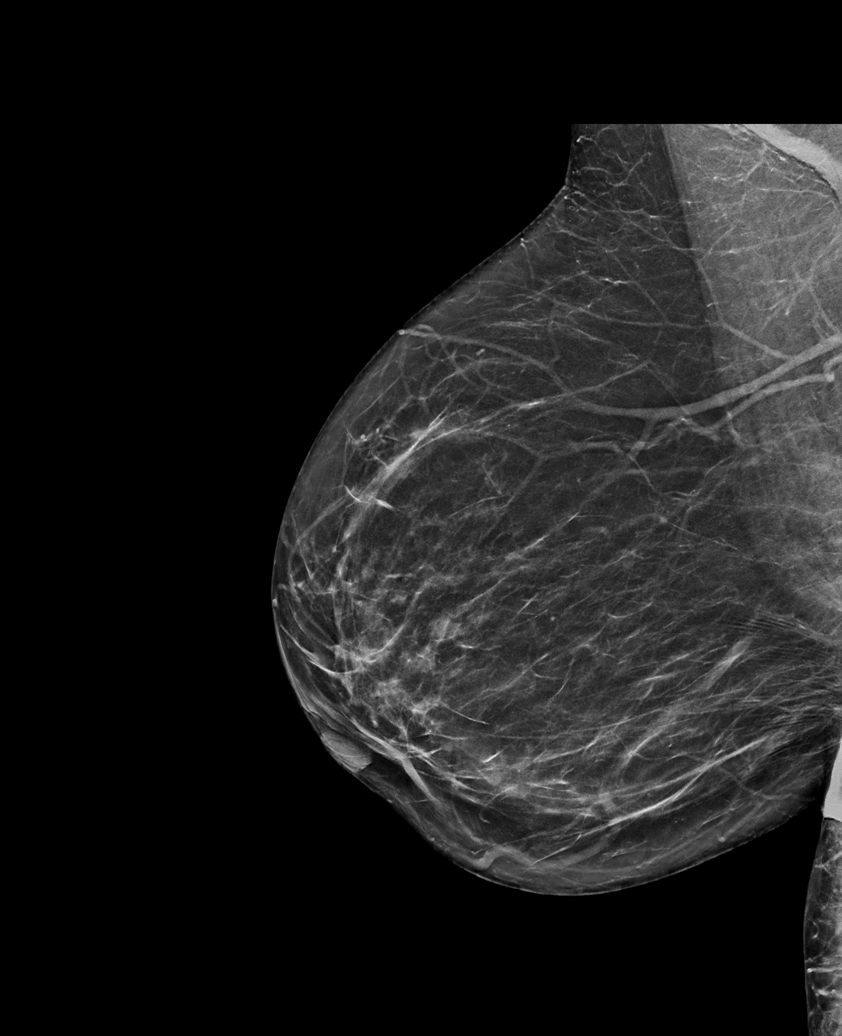

[R CC synth-2D]
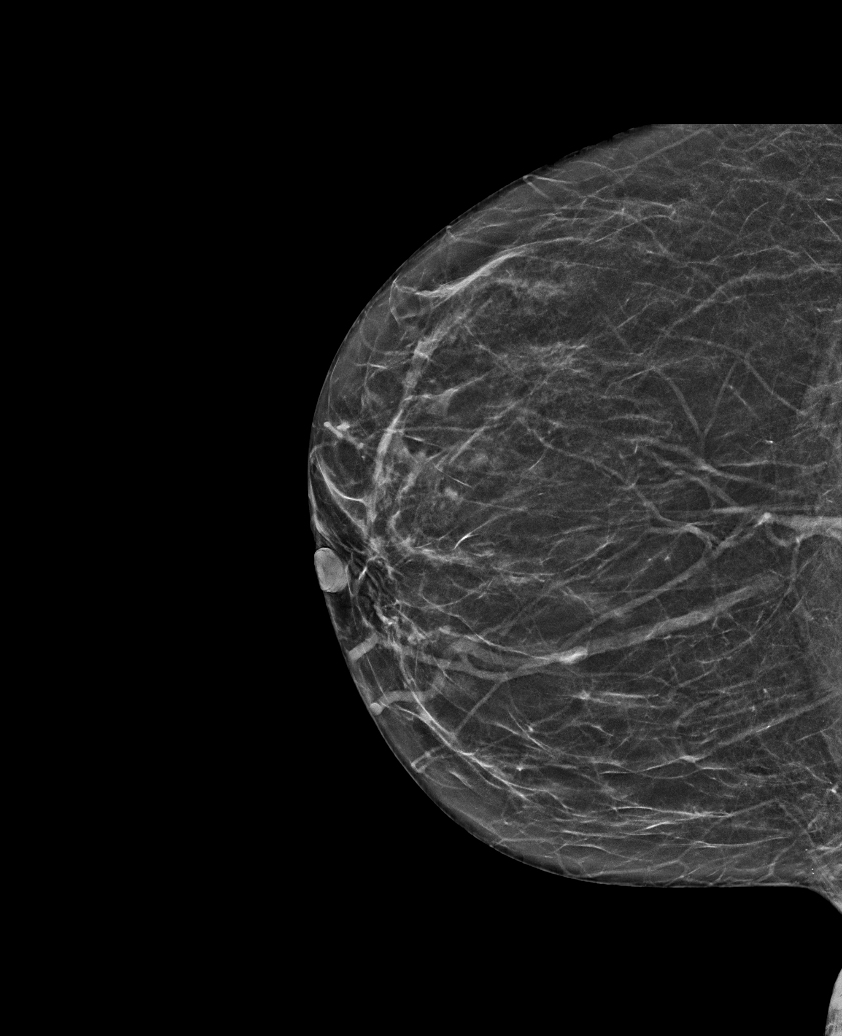

[L MLO synth-2D (2 of 2)]
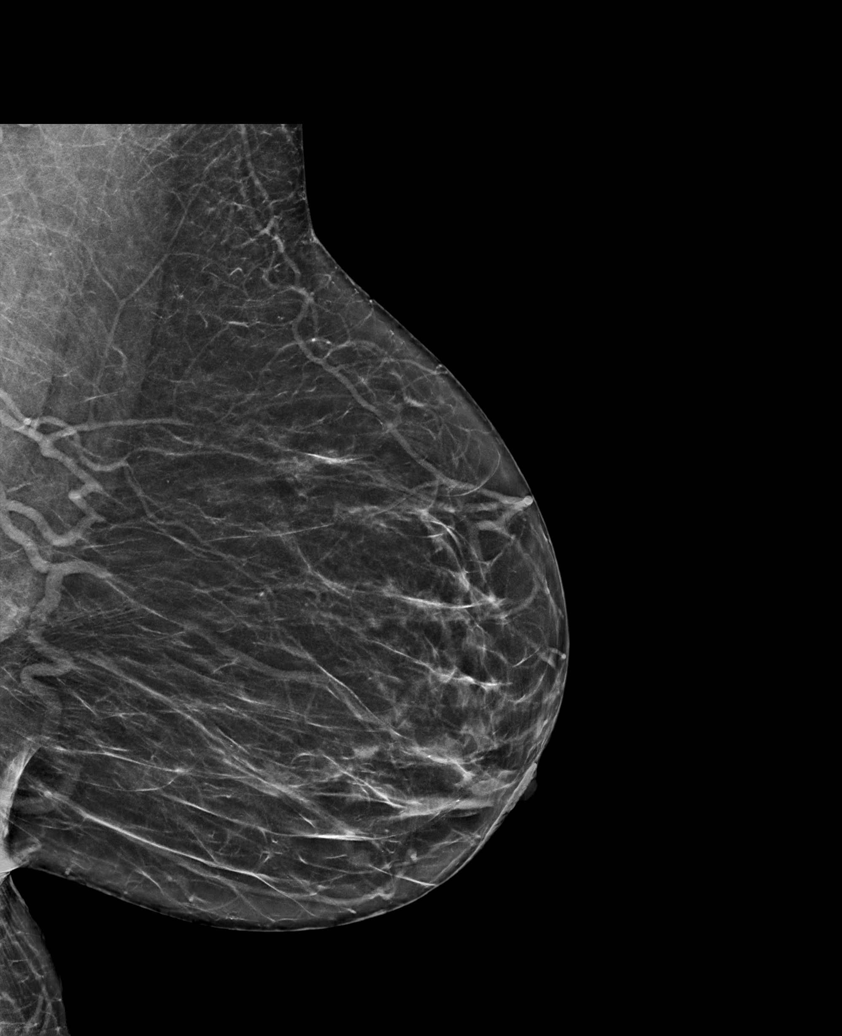

[6 of 36 positions shown; findings below may reference images not displayed]

ACR Breast Density Category b: There are scattered areas of
fibroglandular density.
FINDINGS: There are no findings suspicious for malignancy.
IMPRESSION: No mammographic evidence of malignancy. A result letter of this
screening mammogram will be mailed directly to the patient.

RECOMMENDATION:
Screening mammogram in one year. (Code:51-O-LD2)

BI-RADS CATEGORY  1: Negative.

## 2023-01-08 ENCOUNTER — Emergency Department (HOSPITAL_COMMUNITY): Payer: 59

## 2023-01-08 ENCOUNTER — Emergency Department (HOSPITAL_COMMUNITY)
Admission: EM | Admit: 2023-01-08 | Discharge: 2023-01-08 | Disposition: A | Discharge status: Home / Self Care | Payer: 59 | Attending: Emergency Medicine | Admitting: Emergency Medicine

## 2023-01-08 ENCOUNTER — Encounter (HOSPITAL_COMMUNITY): Payer: Self-pay | Admitting: Emergency Medicine

## 2023-01-08 ENCOUNTER — Other Ambulatory Visit: Payer: Self-pay

## 2023-01-08 DIAGNOSIS — I10 Essential (primary) hypertension: Secondary | ICD-10-CM | POA: Diagnosis not present

## 2023-01-08 DIAGNOSIS — Z96652 Presence of left artificial knee joint: Secondary | ICD-10-CM | POA: Insufficient documentation

## 2023-01-08 DIAGNOSIS — Z23 Encounter for immunization: Secondary | ICD-10-CM | POA: Diagnosis not present

## 2023-01-08 DIAGNOSIS — Z7982 Long term (current) use of aspirin: Secondary | ICD-10-CM | POA: Insufficient documentation

## 2023-01-08 DIAGNOSIS — S0003XA Contusion of scalp, initial encounter: Secondary | ICD-10-CM | POA: Insufficient documentation

## 2023-01-08 DIAGNOSIS — W109XXA Fall (on) (from) unspecified stairs and steps, initial encounter: Secondary | ICD-10-CM | POA: Insufficient documentation

## 2023-01-08 DIAGNOSIS — M25522 Pain in left elbow: Secondary | ICD-10-CM | POA: Diagnosis present

## 2023-01-08 DIAGNOSIS — S52125A Nondisplaced fracture of head of left radius, initial encounter for closed fracture: Secondary | ICD-10-CM | POA: Diagnosis not present

## 2023-01-08 DIAGNOSIS — W19XXXA Unspecified fall, initial encounter: Secondary | ICD-10-CM

## 2023-01-08 DIAGNOSIS — Z87891 Personal history of nicotine dependence: Secondary | ICD-10-CM | POA: Diagnosis not present

## 2023-01-08 LAB — COMPREHENSIVE METABOLIC PANEL
ALT: 12 U/L (ref 0–44)
AST: 17 U/L (ref 15–41)
Albumin: 3.5 g/dL (ref 3.5–5.0)
Alkaline Phosphatase: 59 U/L (ref 38–126)
Anion gap: 8 (ref 5–15)
BUN: 7 mg/dL — ABNORMAL LOW (ref 8–23)
CO2: 26 mmol/L (ref 22–32)
Calcium: 9.1 mg/dL (ref 8.9–10.3)
Chloride: 105 mmol/L (ref 98–111)
Creatinine, Ser: 0.63 mg/dL (ref 0.44–1.00)
GFR, Estimated: >60 mL/min (ref >60)
Glucose, Bld: 99 mg/dL (ref 70–99)
Potassium: 3.6 mmol/L (ref 3.5–5.1)
Sodium: 139 mmol/L (ref 135–145)
Total Bilirubin: 0.9 mg/dL (ref 0.3–1.2)
Total Protein: 7.5 g/dL (ref 6.5–8.1)

## 2023-01-08 LAB — CBC WITH DIFFERENTIAL/PLATELET
Abs Immature Granulocytes: 0.02 10*3/uL (ref 0.00–0.07)
Basophils Absolute: 0 10*3/uL (ref 0.0–0.1)
Basophils Relative: 0 %
Eosinophils Absolute: 0.1 10*3/uL (ref 0.0–0.5)
Eosinophils Relative: 1 %
HCT: 36 % (ref 36.0–46.0)
Hemoglobin: 11.6 g/dL — ABNORMAL LOW (ref 12.0–15.0)
Immature Granulocytes: 0 %
Lymphocytes Relative: 24 %
Lymphs Abs: 1.6 10*3/uL (ref 0.7–4.0)
MCH: 27.6 pg (ref 26.0–34.0)
MCHC: 32.2 g/dL (ref 30.0–36.0)
MCV: 85.5 fL (ref 80.0–100.0)
Monocytes Absolute: 0.5 10*3/uL (ref 0.1–1.0)
Monocytes Relative: 8 %
Neutro Abs: 4.3 10*3/uL (ref 1.7–7.7)
Neutrophils Relative %: 67 %
Platelets: 250 10*3/uL (ref 150–400)
RBC: 4.21 MIL/uL (ref 3.87–5.11)
RDW: 13.8 % (ref 11.5–15.5)
WBC: 6.5 10*3/uL (ref 4.0–10.5)
nRBC: 0 % (ref 0.0–0.2)

## 2023-01-08 LAB — TROPONIN I (HIGH SENSITIVITY): Troponin I (High Sensitivity): 3 ng/L (ref <18)

## 2023-01-08 MED ORDER — TETANUS-DIPHTH-ACELL PERTUSSIS 5-2.5-18.5 LF-MCG/0.5 IM SUSY
0.5000 mL | PREFILLED_SYRINGE | Freq: Once | INTRAMUSCULAR | Status: AC
  Administered 2023-01-08: 0.5 mL via INTRAMUSCULAR
  Filled 2023-01-08: qty 0.5

## 2023-01-08 MED ORDER — OXYCODONE-ACETAMINOPHEN 5-325 MG PO TABS
1.0000 | ORAL_TABLET | Freq: Once | ORAL | Status: AC
  Administered 2023-01-08: 1 via ORAL
  Filled 2023-01-08: qty 1

## 2023-01-08 MED ORDER — KETOROLAC TROMETHAMINE 15 MG/ML IJ SOLN
15.0000 mg | Freq: Once | INTRAMUSCULAR | Status: AC
  Administered 2023-01-08: 15 mg via INTRAMUSCULAR
  Filled 2023-01-08: qty 1

## 2023-01-08 MED ORDER — OXYCODONE HCL 5 MG PO TABS: 5.0000 mg | ORAL_TABLET | Freq: Four times a day (QID) | ORAL | 0 refills | Status: AC | PRN

## 2023-01-08 MED ORDER — ONDANSETRON 4 MG PO TBDP
8.0000 mg | ORAL_TABLET | Freq: Once | ORAL | Status: AC
  Administered 2023-01-08: 8 mg via ORAL

## 2023-01-08 NOTE — Discharge Instructions (Addendum)
We evaluated you for your fall.  Your x-rays showed that you have a fracture to your radius (bone in your elbow).   This was a very small fracture and should heal on its own.  We have given you a sling.  You still need to follow-up with an orthopedic surgeon.  Please take at 1000 mg of Tylenol every 6 hours as needed for pain.  I have also prescribed you some oxycodone, please only take this as needed for pain not relieved by Tylenol.  Please take 5 mg every 6 hours as needed.  Please do not drink alcohol or drive with taking this medication.  Please also be very careful to avoid falls as it may make you lightheaded or dizzy.  If you have any new or worsening symptoms, severe pain, vomiting, inability to walk, numbness or tingling, weakness or any other new symptoms, please return to the emergency department for reassessment.

## 2023-01-08 NOTE — ED Notes (Signed)
Ortho tech called 

## 2023-01-08 NOTE — ED Provider Triage Note (Signed)
Emergency Medicine Provider Triage Evaluation Note  [name] , a 62 y.o. female  was evaluated in triage.  Pt complains of fall down 4 flights of stairs today. Unsure what happened. Denies chest pain, SOB. Endorses headache, neck pain, upper back pain, L shoulder/arm/hand pain and L knee pain. Unknown last Tetanus. No LOC..  Review of Systems  Positive: Head injury Negative: Chest pain  Physical Exam  BP (!) 175/95 (BP Location: Right Arm)   Pulse 63   Temp 98 F (36.7 C) (Oral)   Resp 17   Ht 5' 8.5" (1.74 m)   Wt 103.9 kg   SpO2 100%   BMI 34.31 kg/m  Gen:   Awake, no distress   Resp:  Normal effort  MSK:   Moves extremities without difficulty  Other:  +ttp of L scapula, humerus, radius/ulna, hand, L knee. No stepoffs. TTP of cervical and thoracic spine.   Medical Decision Making  Medically screening exam initiated at 2:47 PM.  Appropriate orders placed.  [name] was informed that the remainder of the evaluation will be completed by another provider, this initial triage assessment does not replace that evaluation, and the importance of remaining in the ED until their evaluation is complete.     Pete Pelt, [place] 01/08/23 1449

## 2023-01-08 NOTE — ED Provider Notes (Signed)
[place] EMERGENCY DEPARTMENT AT [facility] Provider Note  CSN: [id number] Arrival date & time: 01/08/23 1412  Chief Complaint(s) Fall  HPI [name] is a 62 y.o. female history of hypertension, hyperlipidemia presenting to the emergency department after fall.  Patient reports she was walking down stairs, thinks she might of missed a step or her knee gave out and fell down 4 steps.  She reports she landed on her left side of her head.  Reports mild headache.  No pain on the right side.  No nausea or vomiting.  She reports pain in her left elbow, wrist, back, neck, also left knee, left ankle.  No chest pain, shortness of breath, abdominal pain.  Does not think she lost consciousness.   Past Medical History Past Medical History:  Diagnosis Date   Anorectal pain    Arthritis of left knee    Bulging lumbar disc    Bunion, left    Cyst of right kidney    DJD (degenerative joint disease)    DVT (deep venous thrombosis) (HCC)    After Left knee surgery 2012   Edema    Fibroid    GERD (gastroesophageal reflux disease)    H/O endoscopy    H/O gastric sleeve    History of colon surgery    History of fasciotomy    left mid arch   History of hysterectomy 2005   History of left knee replacement    History of lumpectomy    History of revision of total replacement of left knee joint    Hypertension    borderline no meds   Lipedema    Metal plates in left arm    Morbid obesity (HCC)    Pinched nerve    Plantar fasciitis, left    Sleep apnea    very mild no cpap at this time   Vertigo    Patient Active Problem List   Diagnosis Date Noted   S/P total knee arthroplasty, right 07/25/2021   Osteoarthritis of right knee 07/21/2021   Protein-calorie malnutrition (HCC) 12/11/2016   S/P bariatric surgery 12/11/2016   Acid reflux 05/10/2015   Morbid obesity (HCC) 05/10/2015   Obstructive apnea 05/10/2015   Home Medication(s) Prior to Admission medications    Medication Sig Start Date End Date Taking? Authorizing Provider  oxyCODONE (ROXICODONE) 5 MG immediate release tablet Take 1 tablet (5 mg total) by mouth every 6 (six) hours as needed for severe pain. 01/08/23  Yes Lonell Grandchild, MD  aspirin EC 325 MG tablet Take 1 tablet (325 mg total) by mouth 2 (two) times daily. 07/25/21   Gean Birchwood, MD  Cholecalciferol (VITAMIN D) 125 MCG (5000 UT) CAPS Take 5,000 Units/day by mouth daily. 05/18/21   Pricilla Riffle, MD  furosemide (LASIX) 20 MG tablet Take 20 mg by mouth daily as needed for edema. 03/10/21   [provider]  levocetirizine (XYZAL) 5 MG tablet Take 5 mg by mouth daily as needed for allergies. 09/12/15   [provider]  meclizine (ANTIVERT) 12.5 MG tablet Take 12.5-25 mg by mouth 2 (two) times daily as needed for dizziness. 05/06/21   [provider]  Multiple Vitamins-Minerals (MULTIVITAMIN PO) Take 1 tablet by mouth daily.    [provider]  omeprazole (PRILOSEC) 40 MG capsule Take 40 mg by mouth 2 (two) times daily. 02/26/21   [provider]  tizanidine (ZANAFLEX) 2 MG capsule Take 2 capsules (4 mg total) by mouth 3 (three)  times daily as needed for muscle spasms. 07/25/21   Gean Birchwood, MD                                                                                                                                    Past Surgical History Past Surgical History:  Procedure Laterality Date   ABDOMINAL HYSTERECTOMY     BREAST SURGERY     lumps removed benign   ESOPHAGEAL MANOMETRY N/A 04/05/2022   Procedure: ESOPHAGEAL MANOMETRY (EM);  Surgeon: Charlott Rakes, MD;  Location: [facility];  Service: Gastroenterology;  Laterality: N/A;   ORIF RADIAL FRACTURE Left    REVISION TOTAL KNEE ARTHROPLASTY     TOTAL KNEE ARTHROPLASTY Left    TOTAL KNEE ARTHROPLASTY Right 07/25/2021   Procedure: RIGHT TOTAL KNEE ARTHROPLASTY;  Surgeon: Gean Birchwood, MD;  Location: [facility];  Service: Orthopedics;   Laterality: Right;   Family History Family History  Problem Relation Age of Onset   Hypertension Mother    Cervical cancer Mother    Hypertension Father    Diabetes Father    Throat cancer Father    Colon polyps Father    Hypertension Sister    Ovarian cancer Sister    Ovarian cancer Maternal Grandmother    Diabetes Paternal Grandmother     Social History Social History   Tobacco Use   Smoking status: Former    Types: Cigarettes    Quit date: 1987    Years since quitting: 37.5   Smokeless tobacco: Never  Vaping Use   Vaping Use: Never used  Substance Use Topics   Alcohol use: Yes    Comment: social   Drug use: No   Allergies Codeine  Review of Systems Review of Systems  All other systems reviewed and are negative.   Physical Exam Vital Signs  I have reviewed the triage vital signs BP 138/73   Pulse 71   Temp 98.1 F (36.7 C)   Resp 18   Ht 5' 8.5" (1.74 m)   Wt 103.9 kg   SpO2 99%   BMI 34.31 kg/m  Physical Exam Vitals and nursing note reviewed.  Constitutional:      General: She is not in acute distress.    Appearance: She is well-developed.  HENT:     Head: Normocephalic.     Comments: Small left frontal hematoma    Mouth/Throat:     Mouth: Mucous membranes are moist.  Eyes:     Pupils: Pupils are equal, round, and reactive to light.  Cardiovascular:     Rate and Rhythm: Normal rate and regular rhythm.     Heart sounds: No murmur heard. Pulmonary:     Effort: Pulmonary effort is normal. No respiratory distress.     Breath sounds: Normal breath sounds.  Abdominal:     General: Abdomen is flat.     Palpations: Abdomen is soft.     Tenderness: There  is no abdominal tenderness.  Musculoskeletal:     Right lower leg: No edema.     Left lower leg: No edema.     Comments: Full active range of motion of the right upper and right lower extremity without focal tenderness or deformity.  No tenderness of the left clavicle or shoulder area, moderate  tenderness to the left elbow, mild left wrist tenderness on the ulnar aspect without snuffbox tenderness to the hand or other hand tenderness.  Mild midline cervical and thoracic tenderness, no lumbar tenderness, no step-off.  No chest wall tenderness.  Skin:    General: Skin is warm and dry.  Neurological:     General: No focal deficit present.     Mental Status: She is alert. Mental status is at baseline.  Psychiatric:        Mood and Affect: Mood normal.        Behavior: Behavior normal.     ED Results and Treatments Labs (all labs ordered are listed, but only abnormal results are displayed) Labs Reviewed  CBC WITH DIFFERENTIAL/PLATELET - Abnormal; Notable for the following components:      Result Value   Hemoglobin 11.6 (*)    All other components within normal limits  COMPREHENSIVE METABOLIC PANEL - Abnormal; Notable for the following components:   BUN 7 (*)    All other components within normal limits  TROPONIN I (HIGH SENSITIVITY)                                                                                                                          Radiology DG Ankle Complete Left  Result Date: 01/08/2023 CLINICAL DATA:  Status post fall.  Ankle swelling. EXAM: LEFT ANKLE COMPLETE - 3+ VIEW COMPARISON:  None Available. FINDINGS: There is no evidence of fracture, dislocation, or joint effusion. There is a moderate plantar calcaneal spur. No erosive change. Generalized soft tissue edema. IMPRESSION: Generalized soft tissue edema. No fracture or subluxation. Electronically Signed   By: Narda Rutherford M.D.   On: 01/08/2023 19:03   DG Elbow Complete Left  Result Date: 01/08/2023 CLINICAL DATA:  Elbow pain.  Fall. EXAM: LEFT ELBOW - COMPLETE 3+ VIEW COMPARISON:  Humerus and forearm radiographs earlier today. FINDINGS: There is an elbow joint effusion. Suspected radial head/neck fracture, nondisplaced. No additional fracture. Mild degenerative spurring of the coronoid. Soft tissue  edema seen. IMPRESSION: Elbow joint effusion with suspected nondisplaced radial head/neck fracture. Electronically Signed   By: Narda Rutherford M.D.   On: 01/08/2023 19:02   CT Thoracic Spine Wo Contrast  Result Date: 01/08/2023 CLINICAL DATA:  Back trauma, no prior imaging (Age >= 16y) EXAM: CT THORACIC SPINE WITHOUT CONTRAST TECHNIQUE: Multidetector CT images of the thoracic were obtained using the standard protocol without intravenous contrast. RADIATION DOSE REDUCTION: This exam was performed according to the departmental dose-optimization program which includes automated exposure control, adjustment of the mA and/or kV according to patient size and/or use of iterative reconstruction  technique. COMPARISON:  None Available. FINDINGS: Alignment: Moderate dextroscoliotic curvature. No traumatic subluxation. Vertebrae: No fracture. Normal vertebral body heights. The posterior elements are intact. No suspicious bone lesion. Paraspinal and other soft tissues: No acute findings. Disc levels: Disc space narrowing with spurring in the midthoracic spine most prominent at T8-T9. There is no spinal canal stenosis. IMPRESSION: 1. No acute fracture or traumatic subluxation of the thoracic spine. 2. Moderate dextroscoliotic curvature. 3. Mild degenerative disc disease in the midthoracic spine. Electronically Signed   By: Narda Rutherford M.D.   On: 01/08/2023 17:02   CT Cervical Spine Wo Contrast  Result Date: 01/08/2023 CLINICAL DATA:  Neck trauma, midline tenderness (Age 3-64y) Fall. EXAM: CT CERVICAL SPINE WITHOUT CONTRAST TECHNIQUE: Multidetector CT imaging of the cervical spine was performed without intravenous contrast. Multiplanar CT image reconstructions were also generated. RADIATION DOSE REDUCTION: This exam was performed according to the departmental dose-optimization program which includes automated exposure control, adjustment of the mA and/or kV according to patient size and/or use of iterative  reconstruction technique. COMPARISON:  None Available. FINDINGS: Alignment: Broad-based dextroscoliotic curvature. No traumatic subluxation. Skull base and vertebrae: No acute fracture. Vertebral body heights are maintained. The dens and skull base are intact. Soft tissues and spinal canal: No prevertebral fluid or swelling. No visible canal hematoma. Disc levels:  Disc space narrowing and spurring C5-C6 and C6-C7. Upper chest: No acute or traumatic findings. Other: None. IMPRESSION: 1. No acute fracture or traumatic subluxation of the cervical spine. 2. Broad-based dextroscoliotic curvature with mild degenerative disc disease. Electronically Signed   By: Narda Rutherford M.D.   On: 01/08/2023 16:58   CT Head Wo Contrast  Result Date: 01/08/2023 CLINICAL DATA:  Blunt trauma, fall. EXAM: CT HEAD WITHOUT CONTRAST TECHNIQUE: Contiguous axial images were obtained from the base of the skull through the vertex without intravenous contrast. RADIATION DOSE REDUCTION: This exam was performed according to the departmental dose-optimization program which includes automated exposure control, adjustment of the mA and/or kV according to patient size and/or use of iterative reconstruction technique. COMPARISON:  None Available. FINDINGS: Brain: No intracranial hemorrhage, mass effect, or midline shift. No hydrocephalus. The basilar cisterns are patent. No evidence of territorial infarct or acute ischemia. No extra-axial or intracranial fluid collection. Vascular: No hyperdense vessel or unexpected calcification. Skull: No fracture or focal lesion. Sinuses/Orbits: Paranasal sinuses and mastoid air cells are clear. The visualized orbits are unremarkable. Other: Frontal scalp hematoma to the left of midline. IMPRESSION: Frontal scalp hematoma to the left of midline. No acute intracranial abnormality. No skull fracture. Electronically Signed   By: Narda Rutherford M.D.   On: 01/08/2023 16:55   DG Chest 1 View  Result Date:  01/08/2023 CLINICAL DATA:  Larey Seat EXAM: CHEST  1 VIEW COMPARISON:  07/18/2021 FINDINGS: Rotated towards the left. Allowing for that, the heart and mediastinal shadows are normal and the lungs are clear. No regional fracture is seen. IMPRESSION: No active disease. Rotated towards the left. Electronically Signed   By: Paulina Fusi M.D.   On: 01/08/2023 16:03   DG Forearm Left  Result Date: 01/08/2023 CLINICAL DATA:  Larey Seat EXAM: LEFT FOREARM - 2 VIEW COMPARISON:  None Available. FINDINGS: There is no evidence of fracture or other focal bone lesions. Soft tissues are unremarkable. IMPRESSION: Negative. Electronically Signed   By: Paulina Fusi M.D.   On: 01/08/2023 16:02   DG Hand Complete Left  Result Date: 01/08/2023 CLINICAL DATA:  Larey Seat EXAM: LEFT HAND - COMPLETE 3+ VIEW COMPARISON:  None Available. FINDINGS: There is no evidence of fracture or dislocation. There is no evidence of arthropathy or other focal bone abnormality. Soft tissues are unremarkable. IMPRESSION: Negative. Electronically Signed   By: Paulina Fusi M.D.   On: 01/08/2023 16:02   DG Humerus Left  Result Date: 01/08/2023 CLINICAL DATA:  Larey Seat EXAM: LEFT HUMERUS - 2+ VIEW COMPARISON:  None Available. FINDINGS: There is no evidence of fracture or other focal bone lesions. Soft tissues are unremarkable. IMPRESSION: Negative. Electronically Signed   By: Paulina Fusi M.D.   On: 01/08/2023 16:01   DG Knee Complete 4 Views Left  Result Date: 01/08/2023 CLINICAL DATA:  Larey Seat EXAM: LEFT KNEE - COMPLETE 4+ VIEW COMPARISON:  None Available. FINDINGS: Previous total knee arthroplasty. No visible joint effusion. No definite acute fracture. Patellar tendon shadow is lost at the inferior aspect of the patella and a patellar tendon avulsion is not ruled out. Correlate with clinical exam. No other finding. IMPRESSION: Previous total knee arthroplasty. No definite acute fracture. Patellar tendon shadow is lost at the inferior aspect of the patella and a patellar  tendon avulsion is not ruled out. Electronically Signed   By: Paulina Fusi M.D.   On: 01/08/2023 16:01   DG Shoulder Left  Result Date: 01/08/2023 CLINICAL DATA:  Larey Seat EXAM: LEFT SHOULDER - 2+ VIEW COMPARISON:  None Available. FINDINGS: There is no evidence of fracture or dislocation. There is no evidence of arthropathy or other focal bone abnormality. Soft tissues are unremarkable. IMPRESSION: Negative. Electronically Signed   By: Paulina Fusi M.D.   On: 01/08/2023 16:00    Pertinent labs & imaging results that were available during my care of the patient were reviewed by me and considered in my medical decision making (see MDM for details).  Medications Ordered in ED Medications  Tdap (BOOSTRIX) injection 0.5 mL (0.5 mLs Intramuscular Given 01/08/23 1820)  oxyCODONE-acetaminophen (PERCOCET/ROXICET) 5-325 MG per tablet 1 tablet (1 tablet Oral Given 01/08/23 1819)  ketorolac (TORADOL) 15 MG/ML injection 15 mg (15 mg Intramuscular Given 01/08/23 1818)  ondansetron (ZOFRAN-ODT) disintegrating tablet 8 mg (8 mg Oral Given 01/08/23 1821)                                                                                                                                     Procedures .Ortho Injury Treatment  Date/Time: 01/09/2023 12:14 PM  Performed by: Lonell Grandchild, MD Authorized by: Lonell Grandchild, MD   Consent:    Consent obtained:  Verbal   Consent given by:  Patient   Risks discussed:  Stiffness, restricted joint movement, nerve damage and vascular damage   Alternatives discussed:  No treatmentInjury location: elbow Location details: left elbow Injury type: fracture Fracture type: radial head Pre-procedure neurovascular assessment: neurovascularly intact Pre-procedure distal perfusion: normal Pre-procedure neurological function: normal Pre-procedure range of motion: reduced Manipulation performed: no Immobilization: sling Splint Applied by: ED Provider and Ortho  Tech Post-procedure  neurovascular assessment: post-procedure neurovascularly intact Post-procedure distal perfusion: normal Post-procedure neurological function: normal Post-procedure range of motion: unchanged     (including critical care time)  Medical Decision Making / ED Course   MDM:  62 year old presenting to the emergency department with fall.  Patient well-appearing, has tenderness over the left upper and lower extremities.  Her initial x-rays obtained in triage were negative for fracture, only abnormality was a possible left patellar avulsion fracture however patient is able to lift her leg antigravity and without pain extremely low concern for occult patellar fracture or occult knee injury.  Initial x-rays did not include a left elbow or left ankle x-ray and patient does have tenderness in these areas so we will send patient back for x-rays of these areas as well.  Will treat her pain.  Following x-rays will attempt ambulation trial.  No abdominal tenderness, chest wall tenderness to suggest cardiac or thoracic trauma, lungs equal, chest x-ray clear.  It seems that patient's fall was most likely mechanical fall due to missing a step or chronic knee issue rather than syncope as the patient denies loss of consciousness and has had no chest pain or shortness of breath, however a troponin test was obtained in triage which is negative.  Clinical Course as of 01/09/23 1215  Mon Jan 08, 2023  2030 Patient Xr of the left elbow shows nondisplaced radial head fracture. Placed in sling with symptomatic improvement. XR ankle negative. Patient able to ambulate and denies any new pain or symptoms. Advised follow up with orthopedic surgery. Will discharge patient to home. All questions answered. Patient comfortable with plan of discharge. Return precautions discussed with patient and specified on the after visit summary.  [WS]    Clinical Course User Index [WS] Lonell Grandchild, MD     Additional history  obtained: -Additional history obtained from spouse -External records from outside source obtained and reviewed including: Chart review including previous notes, labs, imaging, consultation notes including prior outpatient visits   Lab Tests: -I ordered, reviewed, and interpreted labs.   The pertinent results include:   Labs Reviewed  CBC WITH DIFFERENTIAL/PLATELET - Abnormal; Notable for the following components:      Result Value   Hemoglobin 11.6 (*)    All other components within normal limits  COMPREHENSIVE METABOLIC PANEL - Abnormal; Notable for the following components:   BUN 7 (*)    All other components within normal limits  TROPONIN I (HIGH SENSITIVITY)    Notable for normal troponin  EKG   EKG Interpretation Date/Time:  Monday January 08 2023 15:55:23 EDT Ventricular Rate:  78 PR Interval:  172 QRS Duration:  88 QT Interval:  408 QTC Calculation: 465 R Axis:   17  Text Interpretation: Normal sinus rhythm Normal ECG When compared with ECG of 08-Aug-2013 20:25, No significant change since last tracing Confirmed by Alvino Blood ([id number]) on 01/08/2023 5:44:30 PM         Imaging Studies ordered: I ordered imaging studies including multiple X-rays and CT scans On my interpretation imaging demonstrates left non-displaced radial head fracture I independently visualized and interpreted imaging. I agree with the radiologist interpretation   Medicines ordered and prescription drug management: Meds ordered this encounter  Medications   Tdap (BOOSTRIX) injection 0.5 mL   oxyCODONE-acetaminophen (PERCOCET/ROXICET) 5-325 MG per tablet 1 tablet   ketorolac (TORADOL) 15 MG/ML injection 15 mg   ondansetron (ZOFRAN-ODT) disintegrating tablet 8 mg   oxyCODONE (ROXICODONE) 5 MG immediate release  tablet    Sig: Take 1 tablet (5 mg total) by mouth every 6 (six) hours as needed for severe pain.    Dispense:  12 tablet    Refill:  0    -I have reviewed the patients home  medicines and have made adjustments as needed   Social Determinants of Health:  Diagnosis or treatment significantly limited by social determinants of health: obesity   Reevaluation: After the interventions noted above, I reevaluated the patient and found that their symptoms have improved  Co morbidities that complicate the patient evaluation  Past Medical History:  Diagnosis Date   Anorectal pain    Arthritis of left knee    Bulging lumbar disc    Bunion, left    Cyst of right kidney    DJD (degenerative joint disease)    DVT (deep venous thrombosis) (HCC)    After Left knee surgery 2012   Edema    Fibroid    GERD (gastroesophageal reflux disease)    H/O endoscopy    H/O gastric sleeve    History of colon surgery    History of fasciotomy    left mid arch   History of hysterectomy 2005   History of left knee replacement    History of lumpectomy    History of revision of total replacement of left knee joint    Hypertension    borderline no meds   Lipedema    Metal plates in left arm    Morbid obesity (HCC)    Pinched nerve    Plantar fasciitis, left    Sleep apnea    very mild no cpap at this time   Vertigo       Dispostion: Disposition decision including need for hospitalization was considered, and patient discharged from emergency department.    Final Clinical Impression(s) / ED Diagnoses Final diagnoses:  Fall, initial encounter  Closed nondisplaced fracture of head of left radius, initial encounter     This chart was dictated using voice recognition software.  Despite best efforts to proofread,  errors can occur which can change the documentation meaning.    Lonell Grandchild, MD 01/09/23 1215

## 2023-01-08 NOTE — Progress Notes (Signed)
Orthopedic Tech Progress Note Patient Details:  Princella Craige 01-Jun-1961 [id number] Applied shoulder immobilizer per order.  Ortho Devices Type of Ortho Device: Shoulder immobilizer Ortho Device/Splint Location: LUE Ortho Device/Splint Interventions: Ordered, Application, Adjustment   Post Interventions Patient Tolerated: Well Instructions Provided: Adjustment of device, Care of device  Blase Mess 01/08/2023, 7:51 PM

## 2023-01-08 NOTE — ED Triage Notes (Signed)
Pt presents following a fall approx 1 hour PTA. Sts she is uncertain as to what caused her fall. Endorses feeling lightheaded. Fell on her left side down 4 concrete steps at work. No LOC. Has hematomata to forehead, left arm, left shoulder, and back pain.

## 2023-03-08 ENCOUNTER — Other Ambulatory Visit: Payer: Self-pay | Admitting: Family Medicine

## 2023-03-08 DIAGNOSIS — Z1231 Encounter for screening mammogram for malignant neoplasm of breast: Secondary | ICD-10-CM

## 2023-03-27 ENCOUNTER — Ambulatory Visit: Payer: 59

## 2023-03-27 ENCOUNTER — Ambulatory Visit
Admission: RE | Admit: 2023-03-27 | Discharge: 2023-03-27 | Disposition: A | Discharge status: Home / Self Care | Payer: 59 | Source: Ambulatory Visit | Attending: Family Medicine | Admitting: Family Medicine

## 2023-03-27 DIAGNOSIS — Z1231 Encounter for screening mammogram for malignant neoplasm of breast: Secondary | ICD-10-CM

## 2023-03-30 ENCOUNTER — Other Ambulatory Visit: Payer: Self-pay | Admitting: Family Medicine

## 2023-03-30 DIAGNOSIS — R928 Other abnormal and inconclusive findings on diagnostic imaging of breast: Secondary | ICD-10-CM

## 2023-04-04 ENCOUNTER — Inpatient Hospital Stay
Admission: RE | Admit: 2023-04-04 | Discharge: 2023-04-04 | Discharge status: Home / Self Care | Payer: 59 | Source: Ambulatory Visit | Attending: Family Medicine | Admitting: Family Medicine

## 2023-04-04 DIAGNOSIS — R928 Other abnormal and inconclusive findings on diagnostic imaging of breast: Secondary | ICD-10-CM

## 2024-03-04 ENCOUNTER — Other Ambulatory Visit: Payer: Self-pay | Admitting: Family Medicine

## 2024-03-04 DIAGNOSIS — Z1231 Encounter for screening mammogram for malignant neoplasm of breast: Secondary | ICD-10-CM

## 2024-03-27 ENCOUNTER — Ambulatory Visit
Admission: RE | Admit: 2024-03-27 | Discharge: 2024-03-27 | Disposition: A | Discharge status: Home / Self Care | Source: Ambulatory Visit | Attending: Family Medicine | Admitting: Family Medicine

## 2024-03-27 DIAGNOSIS — Z1231 Encounter for screening mammogram for malignant neoplasm of breast: Secondary | ICD-10-CM
